# Patient Record
Sex: Female | Born: 1970 | Race: Black or African American | Hispanic: No | Marital: Single | Smoking: Current every day smoker
Health system: Southern US, Community
[De-identification: ages and names within clinical notes are randomized; demographics above are authoritative.]

## PROBLEM LIST (undated history)

## (undated) DIAGNOSIS — I10 Essential (primary) hypertension: Secondary | ICD-10-CM

## (undated) HISTORY — PX: LEG SURGERY: SHX1003

## (undated) HISTORY — PX: TUBAL LIGATION: SHX77

## (undated) HISTORY — PX: DILATION AND CURETTAGE OF UTERUS: SHX78

---

## 2002-09-26 ENCOUNTER — Emergency Department (HOSPITAL_COMMUNITY): Admission: EM | Admit: 2002-09-26 | Discharge: 2002-09-26 | Payer: Self-pay | Admitting: Emergency Medicine

## 2002-12-12 ENCOUNTER — Emergency Department (HOSPITAL_COMMUNITY): Admission: EM | Admit: 2002-12-12 | Discharge: 2002-12-12 | Payer: Self-pay | Admitting: Emergency Medicine

## 2005-03-17 ENCOUNTER — Emergency Department (HOSPITAL_COMMUNITY): Admission: EM | Admit: 2005-03-17 | Discharge: 2005-03-17 | Payer: Self-pay | Admitting: Emergency Medicine

## 2005-05-27 ENCOUNTER — Ambulatory Visit: Payer: Self-pay | Admitting: Family Medicine

## 2005-06-20 ENCOUNTER — Ambulatory Visit (HOSPITAL_COMMUNITY): Admission: RE | Admit: 2005-06-20 | Discharge: 2005-06-20 | Payer: Self-pay | Admitting: General Surgery

## 2005-06-20 ENCOUNTER — Ambulatory Visit (HOSPITAL_BASED_OUTPATIENT_CLINIC_OR_DEPARTMENT_OTHER): Admission: RE | Admit: 2005-06-20 | Discharge: 2005-06-20 | Payer: Self-pay | Admitting: General Surgery

## 2005-06-20 ENCOUNTER — Encounter (INDEPENDENT_AMBULATORY_CARE_PROVIDER_SITE_OTHER): Payer: Self-pay | Admitting: *Deleted

## 2005-09-30 ENCOUNTER — Encounter: Admission: RE | Admit: 2005-09-30 | Discharge: 2005-09-30 | Payer: Self-pay | Admitting: Family Medicine

## 2005-10-21 ENCOUNTER — Encounter: Admission: RE | Admit: 2005-10-21 | Discharge: 2005-10-21 | Payer: Self-pay | Admitting: Family Medicine

## 2007-03-26 ENCOUNTER — Emergency Department (HOSPITAL_COMMUNITY): Admission: EM | Admit: 2007-03-26 | Discharge: 2007-03-26 | Payer: Self-pay | Admitting: Emergency Medicine

## 2009-04-06 ENCOUNTER — Ambulatory Visit: Payer: Self-pay | Admitting: Physician Assistant

## 2009-04-06 DIAGNOSIS — J309 Allergic rhinitis, unspecified: Secondary | ICD-10-CM | POA: Insufficient documentation

## 2009-04-06 DIAGNOSIS — R030 Elevated blood-pressure reading, without diagnosis of hypertension: Secondary | ICD-10-CM | POA: Insufficient documentation

## 2009-04-06 DIAGNOSIS — R599 Enlarged lymph nodes, unspecified: Secondary | ICD-10-CM | POA: Insufficient documentation

## 2009-04-07 ENCOUNTER — Encounter: Payer: Self-pay | Admitting: Physician Assistant

## 2009-04-08 LAB — CONVERTED CEMR LAB
ALT: 8 units/L (ref 0–35)
AST: 11 units/L (ref 0–37)
Albumin: 4.2 g/dL (ref 3.5–5.2)
Alkaline Phosphatase: 57 units/L (ref 39–117)
BUN: 10 mg/dL (ref 6–23)
Basophils Absolute: 0 10*3/uL (ref 0.0–0.1)
Basophils Relative: 0 % (ref 0–1)
CO2: 22 meq/L (ref 19–32)
Calcium: 9.4 mg/dL (ref 8.4–10.5)
Chloride: 106 meq/L (ref 96–112)
Creatinine, Ser: 0.8 mg/dL (ref 0.40–1.20)
Eosinophils Absolute: 0.3 10*3/uL (ref 0.0–0.7)
Eosinophils Relative: 3 % (ref 0–5)
Glucose, Bld: 90 mg/dL (ref 70–99)
HCT: 42.2 % (ref 36.0–46.0)
Hemoglobin: 14 g/dL (ref 12.0–15.0)
Lymphocytes Relative: 28 % (ref 12–46)
Lymphs Abs: 3.6 10*3/uL (ref 0.7–4.0)
MCHC: 33.2 g/dL (ref 30.0–36.0)
MCV: 100.2 fL — ABNORMAL HIGH (ref 78.0–100.0)
Monocytes Absolute: 0.9 10*3/uL (ref 0.1–1.0)
Monocytes Relative: 7 % (ref 3–12)
Neutro Abs: 7.9 10*3/uL — ABNORMAL HIGH (ref 1.7–7.7)
Neutrophils Relative %: 62 % (ref 43–77)
Platelets: 254 10*3/uL (ref 150–400)
Potassium: 4.8 meq/L (ref 3.5–5.3)
RBC: 4.21 M/uL (ref 3.87–5.11)
RDW: 12.9 % (ref 11.5–15.5)
Sodium: 138 meq/L (ref 135–145)
Total Bilirubin: 0.4 mg/dL (ref 0.3–1.2)
Total Protein: 7.1 g/dL (ref 6.0–8.3)
WBC: 12.8 10*3/uL — ABNORMAL HIGH (ref 4.0–10.5)

## 2009-04-16 ENCOUNTER — Encounter: Admission: RE | Admit: 2009-04-16 | Discharge: 2009-04-16 | Payer: Self-pay | Admitting: Internal Medicine

## 2009-04-17 ENCOUNTER — Encounter: Payer: Self-pay | Admitting: Physician Assistant

## 2009-04-24 ENCOUNTER — Ambulatory Visit: Payer: Self-pay | Admitting: Physician Assistant

## 2009-04-28 ENCOUNTER — Telehealth: Payer: Self-pay | Admitting: Physician Assistant

## 2009-04-29 ENCOUNTER — Encounter: Payer: Self-pay | Admitting: Physician Assistant

## 2009-04-29 LAB — CONVERTED CEMR LAB
Basophils Absolute: 0 10*3/uL (ref 0.0–0.1)
Basophils Relative: 0 % (ref 0–1)
Eosinophils Absolute: 0.2 10*3/uL (ref 0.0–0.7)
Eosinophils Relative: 1 % (ref 0–5)
Folate: 7.8 ng/mL
HCT: 41 % (ref 36.0–46.0)
Hemoglobin: 13.1 g/dL (ref 12.0–15.0)
Lymphocytes Relative: 31 % (ref 12–46)
Lymphs Abs: 4 10*3/uL (ref 0.7–4.0)
MCHC: 32 g/dL (ref 30.0–36.0)
MCV: 102.5 fL — ABNORMAL HIGH (ref 78.0–100.0)
Monocytes Absolute: 1.1 10*3/uL — ABNORMAL HIGH (ref 0.1–1.0)
Monocytes Relative: 8 % (ref 3–12)
Neutro Abs: 7.7 10*3/uL (ref 1.7–7.7)
Neutrophils Relative %: 59 % (ref 43–77)
Platelets: 274 10*3/uL (ref 150–400)
RBC: 4 M/uL (ref 3.87–5.11)
RDW: 13.4 % (ref 11.5–15.5)
WBC: 12.9 10*3/uL — ABNORMAL HIGH (ref 4.0–10.5)

## 2009-05-04 DIAGNOSIS — R799 Abnormal finding of blood chemistry, unspecified: Secondary | ICD-10-CM | POA: Insufficient documentation

## 2009-05-04 LAB — CONVERTED CEMR LAB
TSH: 1.99 microintl units/mL (ref 0.350–4.500)
Vitamin B-12: 440 pg/mL (ref 211–911)

## 2009-05-05 ENCOUNTER — Telehealth: Payer: Self-pay | Admitting: Physician Assistant

## 2009-05-15 ENCOUNTER — Encounter (INDEPENDENT_AMBULATORY_CARE_PROVIDER_SITE_OTHER): Payer: Self-pay | Admitting: *Deleted

## 2009-06-23 ENCOUNTER — Ambulatory Visit: Payer: Self-pay | Admitting: Physician Assistant

## 2009-06-24 ENCOUNTER — Telehealth: Payer: Self-pay | Admitting: Physician Assistant

## 2009-06-24 ENCOUNTER — Encounter: Payer: Self-pay | Admitting: Physician Assistant

## 2009-06-25 ENCOUNTER — Encounter: Payer: Self-pay | Admitting: Physician Assistant

## 2009-06-26 ENCOUNTER — Encounter: Payer: Self-pay | Admitting: Physician Assistant

## 2009-06-26 ENCOUNTER — Ambulatory Visit (HOSPITAL_COMMUNITY): Admission: RE | Admit: 2009-06-26 | Discharge: 2009-06-26 | Payer: Self-pay | Admitting: Internal Medicine

## 2009-06-26 LAB — CONVERTED CEMR LAB
Basophils Absolute: 0.1 10*3/uL (ref 0.0–0.1)
Basophils Relative: 0 % (ref 0–1)
Eosinophils Absolute: 0.4 10*3/uL (ref 0.0–0.7)
Eosinophils Relative: 3 % (ref 0–5)
HCT: 44 % (ref 36.0–46.0)
Hemoglobin: 14.2 g/dL (ref 12.0–15.0)
Lymphocytes Relative: 27 % (ref 12–46)
Lymphs Abs: 3.8 10*3/uL (ref 0.7–4.0)
MCHC: 32.3 g/dL (ref 30.0–36.0)
MCV: 101.6 fL — ABNORMAL HIGH (ref 78.0–100.0)
Monocytes Absolute: 1.3 10*3/uL — ABNORMAL HIGH (ref 0.1–1.0)
Monocytes Relative: 10 % (ref 3–12)
Neutro Abs: 8.6 10*3/uL — ABNORMAL HIGH (ref 1.7–7.7)
Neutrophils Relative %: 61 % (ref 43–77)
Platelets: 244 10*3/uL (ref 150–400)
RBC: 4.33 M/uL (ref 3.87–5.11)
RDW: 13.1 % (ref 11.5–15.5)
Retic Ct Pct: 1.5 % (ref 0.4–3.1)
WBC: 14.2 10*3/uL — ABNORMAL HIGH (ref 4.0–10.5)

## 2009-08-13 ENCOUNTER — Ambulatory Visit: Payer: Self-pay | Admitting: Physician Assistant

## 2009-08-13 DIAGNOSIS — L723 Sebaceous cyst: Secondary | ICD-10-CM | POA: Insufficient documentation

## 2009-08-13 DIAGNOSIS — I10 Essential (primary) hypertension: Secondary | ICD-10-CM | POA: Insufficient documentation

## 2009-08-21 ENCOUNTER — Telehealth: Payer: Self-pay | Admitting: Physician Assistant

## 2009-08-24 DIAGNOSIS — D72829 Elevated white blood cell count, unspecified: Secondary | ICD-10-CM | POA: Insufficient documentation

## 2009-08-24 LAB — CONVERTED CEMR LAB
Basophils Absolute: 0 10*3/uL (ref 0.0–0.1)
Basophils Relative: 0 % (ref 0–1)
Eosinophils Absolute: 0.3 10*3/uL (ref 0.0–0.7)
Eosinophils Relative: 2 % (ref 0–5)
HCT: 42 % (ref 36.0–46.0)
Hemoglobin: 13.6 g/dL (ref 12.0–15.0)
Lymphocytes Relative: 25 % (ref 12–46)
Lymphs Abs: 3.7 10*3/uL (ref 0.7–4.0)
MCHC: 32.4 g/dL (ref 30.0–36.0)
MCV: 100 fL (ref 78.0–100.0)
Monocytes Absolute: 1 10*3/uL (ref 0.1–1.0)
Monocytes Relative: 7 % (ref 3–12)
Neutro Abs: 9.8 10*3/uL — ABNORMAL HIGH (ref 1.7–7.7)
Neutrophils Relative %: 66 % (ref 43–77)
Platelets: 275 10*3/uL (ref 150–400)
RBC: 4.2 M/uL (ref 3.87–5.11)
RDW: 14 % (ref 11.5–15.5)
WBC: 14.8 10*3/uL — ABNORMAL HIGH (ref 4.0–10.5)

## 2009-08-27 ENCOUNTER — Ambulatory Visit: Payer: Self-pay | Admitting: Physician Assistant

## 2009-09-02 ENCOUNTER — Encounter: Payer: Self-pay | Admitting: Physician Assistant

## 2009-09-08 ENCOUNTER — Ambulatory Visit: Payer: Self-pay | Admitting: Physician Assistant

## 2009-09-16 ENCOUNTER — Ambulatory Visit: Payer: Self-pay | Admitting: Physician Assistant

## 2009-09-16 LAB — CONVERTED CEMR LAB
BUN: 9 mg/dL (ref 6–23)
CO2: 24 meq/L (ref 19–32)
Calcium: 9.1 mg/dL (ref 8.4–10.5)
Chloride: 105 meq/L (ref 96–112)
Creatinine, Ser: 0.59 mg/dL (ref 0.40–1.20)
Glucose, Bld: 78 mg/dL (ref 70–99)
Potassium: 4.5 meq/L (ref 3.5–5.3)
Sodium: 140 meq/L (ref 135–145)

## 2009-09-18 ENCOUNTER — Encounter: Payer: Self-pay | Admitting: Physician Assistant

## 2009-09-20 ENCOUNTER — Encounter: Payer: Self-pay | Admitting: Physician Assistant

## 2009-09-22 ENCOUNTER — Ambulatory Visit: Payer: Self-pay | Admitting: Physician Assistant

## 2009-09-22 DIAGNOSIS — R82998 Other abnormal findings in urine: Secondary | ICD-10-CM | POA: Insufficient documentation

## 2009-09-22 DIAGNOSIS — F17200 Nicotine dependence, unspecified, uncomplicated: Secondary | ICD-10-CM | POA: Insufficient documentation

## 2009-09-22 LAB — CONVERTED CEMR LAB
Bilirubin Urine: NEGATIVE
Glucose, Urine, Semiquant: NEGATIVE
KOH Prep: NEGATIVE
Ketones, urine, test strip: NEGATIVE
Nitrite: NEGATIVE
Protein, U semiquant: NEGATIVE
Rapid HIV Screen: NEGATIVE
Specific Gravity, Urine: 1.02
Urobilinogen, UA: 0.2
WBC Urine, dipstick: NEGATIVE
Whiff Test: NEGATIVE
pH: 6

## 2009-09-23 ENCOUNTER — Encounter: Payer: Self-pay | Admitting: Physician Assistant

## 2009-09-24 LAB — CONVERTED CEMR LAB
Casts: NONE SEEN /lpf
Chlamydia, DNA Probe: NEGATIVE
Crystals: NONE SEEN
GC Probe Amp, Genital: NEGATIVE

## 2009-10-01 ENCOUNTER — Ambulatory Visit: Payer: Self-pay | Admitting: Physician Assistant

## 2009-10-02 ENCOUNTER — Encounter: Payer: Self-pay | Admitting: Physician Assistant

## 2009-10-02 LAB — CONVERTED CEMR LAB
ALT: 32 units/L (ref 0–35)
AST: 29 units/L (ref 0–37)
Albumin: 4 g/dL (ref 3.5–5.2)
Alkaline Phosphatase: 52 units/L (ref 39–117)
BUN: 13 mg/dL (ref 6–23)
Basophils Absolute: 0.1 10*3/uL (ref 0.0–0.1)
Basophils Relative: 0 % (ref 0–1)
CO2: 22 meq/L (ref 19–32)
Calcium: 9 mg/dL (ref 8.4–10.5)
Chloride: 106 meq/L (ref 96–112)
Cholesterol, target level: 200 mg/dL
Cholesterol: 153 mg/dL (ref 0–200)
Creatinine, Ser: 0.65 mg/dL (ref 0.40–1.20)
Eosinophils Absolute: 0.2 10*3/uL (ref 0.0–0.7)
Eosinophils Relative: 1 % (ref 0–5)
Glucose, Bld: 88 mg/dL (ref 70–99)
HCT: 41.3 % (ref 36.0–46.0)
HDL goal, serum: 40 mg/dL
HDL: 53 mg/dL (ref >39)
Hemoglobin: 13.4 g/dL (ref 12.0–15.0)
LDL Cholesterol: 82 mg/dL (ref 0–99)
LDL Goal: 130 mg/dL
Lymphocytes Relative: 29 % (ref 12–46)
Lymphs Abs: 3.8 10*3/uL (ref 0.7–4.0)
MCHC: 32.4 g/dL (ref 30.0–36.0)
MCV: 99.3 fL (ref 78.0–100.0)
Monocytes Absolute: 0.9 10*3/uL (ref 0.1–1.0)
Monocytes Relative: 7 % (ref 3–12)
Neutro Abs: 8.2 10*3/uL — ABNORMAL HIGH (ref 1.7–7.7)
Neutrophils Relative %: 62 % (ref 43–77)
Platelets: 231 10*3/uL (ref 150–400)
Potassium: 4.3 meq/L (ref 3.5–5.3)
RBC: 4.16 M/uL (ref 3.87–5.11)
RDW: 13.8 % (ref 11.5–15.5)
Sodium: 137 meq/L (ref 135–145)
Total Bilirubin: 0.6 mg/dL (ref 0.3–1.2)
Total CHOL/HDL Ratio: 2.9
Total Protein: 6.9 g/dL (ref 6.0–8.3)
Triglycerides: 92 mg/dL (ref <150)
VLDL: 18 mg/dL (ref 0–40)
WBC: 13.2 10*3/uL — ABNORMAL HIGH (ref 4.0–10.5)

## 2009-11-20 ENCOUNTER — Ambulatory Visit: Payer: Self-pay | Admitting: Physician Assistant

## 2009-11-20 DIAGNOSIS — B351 Tinea unguium: Secondary | ICD-10-CM | POA: Insufficient documentation

## 2009-11-21 ENCOUNTER — Encounter: Payer: Self-pay | Admitting: Physician Assistant

## 2009-11-21 LAB — CONVERTED CEMR LAB
Basophils Absolute: 0.1 10*3/uL (ref 0.0–0.1)
Basophils Relative: 0 % (ref 0–1)
Eosinophils Absolute: 0.4 10*3/uL (ref 0.0–0.7)
Eosinophils Relative: 3 % (ref 0–5)
HCT: 42 % (ref 36.0–46.0)
Hemoglobin: 13.8 g/dL (ref 12.0–15.0)
Lymphocytes Relative: 33 % (ref 12–46)
Lymphs Abs: 4.1 10*3/uL — ABNORMAL HIGH (ref 0.7–4.0)
MCHC: 32.9 g/dL (ref 30.0–36.0)
MCV: 101.7 fL — ABNORMAL HIGH (ref 78.0–100.0)
Monocytes Absolute: 1.1 10*3/uL — ABNORMAL HIGH (ref 0.1–1.0)
Monocytes Relative: 9 % (ref 3–12)
Neutro Abs: 6.8 10*3/uL (ref 1.7–7.7)
Neutrophils Relative %: 55 % (ref 43–77)
Platelets: 227 10*3/uL (ref 150–400)
RBC: 4.13 M/uL (ref 3.87–5.11)
RDW: 14 % (ref 11.5–15.5)
WBC: 12.4 10*3/uL — ABNORMAL HIGH (ref 4.0–10.5)

## 2009-12-04 ENCOUNTER — Ambulatory Visit: Payer: Self-pay | Admitting: Physician Assistant

## 2009-12-04 LAB — CONVERTED CEMR LAB
BUN: 23 mg/dL (ref 6–23)
CO2: 24 meq/L (ref 19–32)
Calcium: 9.5 mg/dL (ref 8.4–10.5)
Chloride: 102 meq/L (ref 96–112)
Creatinine, Ser: 0.72 mg/dL (ref 0.40–1.20)
Glucose, Bld: 90 mg/dL (ref 70–99)
Potassium: 4.1 meq/L (ref 3.5–5.3)
Sodium: 137 meq/L (ref 135–145)

## 2009-12-07 ENCOUNTER — Encounter: Payer: Self-pay | Admitting: Physician Assistant

## 2009-12-23 ENCOUNTER — Ambulatory Visit: Payer: Self-pay | Admitting: Physician Assistant

## 2010-01-13 ENCOUNTER — Ambulatory Visit: Payer: Self-pay | Admitting: Physician Assistant

## 2010-01-13 LAB — CONVERTED CEMR LAB
ALT: 11 units/L (ref 0–35)
AST: 12 units/L (ref 0–37)
Albumin: 4.1 g/dL (ref 3.5–5.2)
Alkaline Phosphatase: 55 units/L (ref 39–117)
Bilirubin, Direct: 0.1 mg/dL (ref 0.0–0.3)
Indirect Bilirubin: 0.2 mg/dL (ref 0.0–0.9)
Total Bilirubin: 0.3 mg/dL (ref 0.3–1.2)
Total Protein: 7.1 g/dL (ref 6.0–8.3)

## 2010-01-14 ENCOUNTER — Encounter: Payer: Self-pay | Admitting: Physician Assistant

## 2010-02-11 ENCOUNTER — Telehealth: Payer: Self-pay | Admitting: Physician Assistant

## 2010-06-01 ENCOUNTER — Ambulatory Visit: Payer: Self-pay | Admitting: Physician Assistant

## 2010-06-01 DIAGNOSIS — N898 Other specified noninflammatory disorders of vagina: Secondary | ICD-10-CM | POA: Insufficient documentation

## 2010-06-01 DIAGNOSIS — N760 Acute vaginitis: Secondary | ICD-10-CM | POA: Insufficient documentation

## 2010-06-01 LAB — CONVERTED CEMR LAB
Bilirubin Urine: NEGATIVE
Blood in Urine, dipstick: NEGATIVE
Glucose, Urine, Semiquant: NEGATIVE
KOH Prep: NEGATIVE
Ketones, urine, test strip: NEGATIVE
Nitrite: NEGATIVE
Protein, U semiquant: NEGATIVE
Specific Gravity, Urine: 1.025
Urobilinogen, UA: 0.2
WBC Urine, dipstick: NEGATIVE
Whiff Test: NEGATIVE
pH: 5.5

## 2010-06-04 LAB — CONVERTED CEMR LAB
Basophils Absolute: 0.1 10*3/uL (ref 0.0–0.1)
Basophils Relative: 0 % (ref 0–1)
Chlamydia, DNA Probe: NEGATIVE
Eosinophils Absolute: 0.3 10*3/uL (ref 0.0–0.7)
Eosinophils Relative: 2 % (ref 0–5)
GC Probe Amp, Genital: NEGATIVE
HCT: 41.8 % (ref 36.0–46.0)
Hemoglobin: 13.6 g/dL (ref 12.0–15.0)
Lymphocytes Relative: 34 % (ref 12–46)
Lymphs Abs: 4.1 10*3/uL — ABNORMAL HIGH (ref 0.7–4.0)
MCHC: 32.5 g/dL (ref 30.0–36.0)
MCV: 101.5 fL — ABNORMAL HIGH (ref 78.0–100.0)
Monocytes Absolute: 1 10*3/uL (ref 0.1–1.0)
Monocytes Relative: 8 % (ref 3–12)
Neutro Abs: 6.6 10*3/uL (ref 1.7–7.7)
Neutrophils Relative %: 55 % (ref 43–77)
Platelets: 262 10*3/uL (ref 150–400)
RBC: 4.12 M/uL (ref 3.87–5.11)
RDW: 13.9 % (ref 11.5–15.5)
WBC: 12.1 10*3/uL — ABNORMAL HIGH (ref 4.0–10.5)

## 2010-06-08 ENCOUNTER — Encounter: Admission: RE | Admit: 2010-06-08 | Discharge: 2010-06-08 | Payer: Self-pay | Admitting: Internal Medicine

## 2010-06-16 ENCOUNTER — Ambulatory Visit: Payer: Self-pay | Admitting: Internal Medicine

## 2010-06-22 ENCOUNTER — Telehealth: Payer: Self-pay | Admitting: Physician Assistant

## 2010-06-24 ENCOUNTER — Encounter (INDEPENDENT_AMBULATORY_CARE_PROVIDER_SITE_OTHER): Payer: Self-pay | Admitting: *Deleted

## 2010-06-30 ENCOUNTER — Encounter: Payer: Self-pay | Admitting: Physician Assistant

## 2010-06-30 LAB — CBC WITH DIFFERENTIAL/PLATELET
BASO%: 0.3 % (ref 0.0–2.0)
Basophils Absolute: 0 10*3/uL (ref 0.0–0.1)
EOS%: 2 % (ref 0.0–7.0)
Eosinophils Absolute: 0.2 10*3/uL (ref 0.0–0.5)
HCT: 39.6 % (ref 34.8–46.6)
HGB: 13.7 g/dL (ref 11.6–15.9)
LYMPH%: 25.6 % (ref 14.0–49.7)
MCH: 34.3 pg — ABNORMAL HIGH (ref 25.1–34.0)
MCHC: 34.7 g/dL (ref 31.5–36.0)
MCV: 98.8 fL (ref 79.5–101.0)
MONO#: 0.7 10*3/uL (ref 0.1–0.9)
MONO%: 7.8 % (ref 0.0–14.0)
NEUT#: 6 10*3/uL (ref 1.5–6.5)
NEUT%: 64.3 % (ref 38.4–76.8)
Platelets: 220 10*3/uL (ref 145–400)
RBC: 4.01 10*6/uL (ref 3.70–5.45)
RDW: 13.7 % (ref 11.2–14.5)
WBC: 9.4 10*3/uL (ref 3.9–10.3)
lymph#: 2.4 10*3/uL (ref 0.9–3.3)

## 2010-07-05 LAB — COMPREHENSIVE METABOLIC PANEL
ALT: 9 U/L (ref 0–35)
AST: 13 U/L (ref 0–37)
Albumin: 3.8 g/dL (ref 3.5–5.2)
Alkaline Phosphatase: 54 U/L (ref 39–117)
BUN: 12 mg/dL (ref 6–23)
CO2: 22 mEq/L (ref 19–32)
Calcium: 8.8 mg/dL (ref 8.4–10.5)
Chloride: 107 mEq/L (ref 96–112)
Creatinine, Ser: 0.71 mg/dL (ref 0.40–1.20)
Glucose, Bld: 91 mg/dL (ref 70–99)
Potassium: 4.2 mEq/L (ref 3.5–5.3)
Sodium: 136 mEq/L (ref 135–145)
Total Bilirubin: 0.4 mg/dL (ref 0.3–1.2)
Total Protein: 6.5 g/dL (ref 6.0–8.3)

## 2010-07-05 LAB — LEUKOCYTE ALKALINE PHOSPHATASE: Leukocyte Alkaline Phos Stain: 143 — ABNORMAL HIGH (ref 30–140)

## 2010-07-05 LAB — LACTATE DEHYDROGENASE: LDH: 135 U/L (ref 94–250)

## 2010-07-09 ENCOUNTER — Encounter (INDEPENDENT_AMBULATORY_CARE_PROVIDER_SITE_OTHER): Payer: Self-pay | Admitting: Internal Medicine

## 2010-09-26 ENCOUNTER — Encounter: Payer: Self-pay | Admitting: Family Medicine

## 2010-10-05 NOTE — Progress Notes (Signed)
Summary: Axillary LN  ---- Converted from flag ---- ---- 09/20/2009 10:50 AM, Tereso Newcomer PA-C wrote: F/u on path of R axillary mass once done; pending with Dr. Laurell Josephs; if benign, needs further w/u of elev. WBC ------------------------------  Phone Note Outgoing Call   Summary of Call: Please make sure patient makes appt with surgeon when able.  If there is a way to send her to someone else, please do so.  [facility] ??? Initial call taken by: Tereso Newcomer PA-C,  June 22, 2010 1:43 PM  Follow-up for Phone Call        Left message on answering machine for pt to call back.Marland KitchenMarland KitchenMarland KitchenArmenia Shannon  June 22, 2010 4:10 PM  Left message on answer machine for pt. to return call. Gaylyn Cheers RN  June 23, 2010 10:24 AM    Left message on answering machine for pt to call back....will mail letter.[place] Shannon  June 24, 2010 9:43 AM..

## 2010-10-05 NOTE — Letter (Signed)
Summary: EN SURGERY NOTES  EN SURGERY NOTES   Imported By: Arta Bruce 10/22/2009 11:45:09  _____________________________________________________________________  External Attachment:    Type:   Image     Comment:   External Document

## 2010-10-05 NOTE — Letter (Signed)
Summary: *HSN Results Follow up  [facility]  [address] [place], [place] [address]   Phone: [phone]  Fax: [phone]      11/21/2009   [facility] [place] [address] [place], [place]  [address]   Dear  Ms. Saumya Ingwersen,                            ____S.Drinkard,FNP   ____D. Gore,FNP       ____B. McPherson,MD   ____V. Rankins,MD    ____E. Mulberry,MD    ____N. Daphine Deutscher, FNP  ____D. Reche Dixon, MD    ____K. Philipp Deputy, MD    __x__S. Alben Spittle, PA-C     This letter is to inform you that your recent test(s):  _______Pap Smear    ___x____Lab Test     _______X-ray    ___x____ is within acceptable limits  _______ requires a medication change  _______ requires a follow-up lab visit  _______ requires a follow-up visit with your provider   Comments: No significant change on your lab work.  Your Laws blood cell count is still somewhat high.  I need to see what the biopsy on your lymph node shows first before we do anything else.       _________________________________________________________ If you have any questions, please contact our office                     Sincerely,  Tereso Newcomer PA-C [facility]

## 2010-10-05 NOTE — Assessment & Plan Note (Signed)
Summary: 2 MONTH FU///KT   Vital Signs:  Patient profile:   40 year old female Menstrual status:  regular Height:      61 inches Weight:      198 pounds BMI:     37.55 Temp:     97.9 degrees F oral Pulse rate:   70 / minute Pulse rhythm:   regular Resp:     18 per minute BP sitting:   134 / 90  (left arm) Cuff size:   large  Vitals Entered By: [place] Shannon (November 20, 2009 9:25 AM) CC: two month f/u... pt says her left foot is very dry and her big toe that half her nail came off..., Hypertension Management Is Patient Diabetic? No Pain Assessment Patient in pain? no       Does patient need assistance? Functional Status Self care Ambulation Normal   Primary Care Provider:  Tereso Newcomer PA-C  CC:  two month f/u... pt says her left foot is very dry and her big toe that half her nail came off... and Hypertension Management.  History of Present Illness: Here for f/u. Taking BP meds. Feels ok.  Lymphadenopathy:  Still has not had excised.  Wants to wait until May.  Needs to pay off bill with Dr. Janee Morn first.  Does not feel any bigger.  No fever or night sweats.  Has thick yellow toenails on left.  Great toenail fractured this morning.  Slight pain.  Hypertension History:      She denies headache, chest pain, palpitations, dyspnea with exertion, peripheral edema, and syncope.  She notes no problems with any antihypertensive medication side effects.        Positive major cardiovascular risk factors include hypertension and current tobacco user.  Negative major cardiovascular risk factors include female age less than 20 years old and no history of diabetes.        Further assessment for target organ damage reveals no history of ASHD, stroke/TIA, or peripheral vascular disease.     Problems Prior to Update: 1)  Onychomycosis, Toenails  (ICD-110.1) 2)  Smoker  (ICD-305.1) 3)  Urinalysis, Abnormal  (ICD-791.9) 4)  Routine Gynecological Examination  (ICD-V72.31) 5)   Preventive Health Care  (ICD-V70.0) 6)  Leukocytosis  (ICD-288.60) 7)  Essential Hypertension, Benign  (ICD-401.1) 8)  Sebaceous Cyst  (ICD-706.2) 9)  Cbc, Abnormal  (ICD-790.99) 10)  Elevated Bp Reading Without Dx Hypertension  (ICD-796.2) 11)  Lymphadenopathy, Axilla  (ICD-785.6) 12)  Allergic Rhinitis  (ICD-477.9)  Allergies: 1)  ! * Aleve  Physical Exam  General:  alert, well-developed, and well-nourished.   Head:  normocephalic and atraumatic.   Neck:  supple.   Lungs:  normal breath sounds.   Heart:  normal rate and regular rhythm.   Extremities:  no edema left foot with onychomycosis of nails left great toenail fractured Neurologic:  alert & oriented X3 and cranial nerves II-XII intact.   Skin:  scaling of left foot c/w tinea Axillary Nodes:  R axillary LN enlarged.   Psych:  normally interactive.     Impression & Recommendations:  Problem # 1:  ESSENTIAL HYPERTENSION, BENIGN (ICD-401.1) borderline increase Lisinopril/HCTZ to 20/25 check bmet in 2 weeks  Her updated medication list for this problem includes:    Lisinopril-hydrochlorothiazide 20-25 Mg Tabs (Lisinopril-hydrochlorothiazide) .Marland Kitchen... Take 1 tablet by mouth once a day for blood pressure  Problem # 2:  ONYCHOMYCOSIS, TOENAILS (ICD-110.1)  no h/o liver problems LFTs normal in Jan start Lamisil recheck LFTs in 8  weeks  Her updated medication list for this problem includes:    Lamisil 250 Mg Tabs (Terbinafine hcl) .Marland Kitchen... Take 1 tablet by mouth once a day  Problem # 3:  LEUKOCYTOSIS (ICD-288.60)  repeat cbc with bmet in 2 weeks  Orders: T-CBC w/Diff ([id number])  Problem # 4:  LYMPHADENOPATHY, AXILLA (ICD-785.6) w/u in past negative has seen surgeon wants to wait until May to have done was told by surgeon not an emergency I have encouraged her to get done as soon as she can  Her updated medication list for this problem includes:    Metronidazole 500 Mg Tabs (Metronidazole) .Marland KitchenMarland KitchenMarland KitchenMarland Kitchen 4 tabs by  mouth x 1  Problem # 5:  SMOKER (ICD-305.1) has cut back has not tried nicotine patches  Her updated medication list for this problem includes:    Nicoderm Cq 14 Mg/24hr Pt24 (Nicotine) .Marland Kitchen... Apply once daily for 4 weeks; do not smoke with patch on    Nicoderm Cq 7 Mg/24hr Pt24 (Nicotine) .Marland Kitchen... Apply once daily after wearing the 14 mg patch for one month; continue for 2 weeks, then stop.  do not smoke with patch on.  Complete Medication List: 1)  Lisinopril-hydrochlorothiazide 20-25 Mg Tabs (Lisinopril-hydrochlorothiazide) .... Take 1 tablet by mouth once a day for blood pressure 2)  Nicoderm Cq 14 Mg/24hr Pt24 (Nicotine) .... Apply once daily for 4 weeks; do not smoke with patch on 3)  Nicoderm Cq 7 Mg/24hr Pt24 (Nicotine) .... Apply once daily after wearing the 14 mg patch for one month; continue for 2 weeks, then stop.  do not smoke with patch on. 4)  Metronidazole 500 Mg Tabs (Metronidazole) .... 4 tabs by mouth x 1 5)  Lamisil 250 Mg Tabs (Terbinafine hcl) .... Take 1 tablet by mouth once a day  Hypertension Assessment/Plan:      The patient's hypertensive risk group is category B: At least one risk factor (excluding diabetes) with no target organ damage.  Her calculated 10 year risk of coronary heart disease is 3 %.  Today's blood pressure is 134/90.  Her blood pressure goal is < 140/90.  Patient Instructions: 1)  Return in 2 weeks for BMET and CBC  (Dx 401.1, 288.60). 2)  Return in 6-8 weeks for LFTs.  Dx: 110.1 3)  Please schedule a follow-up appointment in 3 months with Scott for blood pressure.  4)  Please schedule appointment with the surgeon as soon as you are able. 5)  Schedule appt at [facility]. podiatry clinic for nail trimming (onychomycosis). Prescriptions: LAMISIL 250 MG TABS (TERBINAFINE HCL) Take 1 tablet by mouth once a day  #30 x 2   Entered and Authorized by:   Tereso Newcomer PA-C   Signed by:   Tereso Newcomer PA-C on 11/20/2009   Method used:   Print then Give to  Patient   RxID:   [id number] LISINOPRIL-HYDROCHLOROTHIAZIDE 20-25 MG TABS (LISINOPRIL-HYDROCHLOROTHIAZIDE) Take 1 tablet by mouth once a day for blood pressure  #30 x 5   Entered and Authorized by:   Tereso Newcomer PA-C   Signed by:   Tereso Newcomer PA-C on 11/20/2009   Method used:   Print then Give to Patient   RxID:   [id number]

## 2010-10-05 NOTE — Progress Notes (Signed)
Summary: Axillary Mass f/u  Phone Note Outgoing Call   Summary of Call: Has she had axillary mass removed yet with Dr. Janee Morn? Initial call taken by: Tereso Newcomer PA-C,  February 11, 2010 4:11 PM  Follow-up for Phone Call        Left message on answering machine for pt to call back...Marland KitchenMarland KitchenArmenia Shannon  February 12, 2010 9:58 AM   pt says she doesnt have the funds to go to appt.... pt says she will as soon as she pays down the bill she already have Follow-up by: [place] Shannon,  February 12, 2010 2:41 PM

## 2010-10-05 NOTE — Progress Notes (Signed)
Summary: Office Visit//DEPRESSION SCREENING  Office Visit//DEPRESSION SCREENING   Imported By: Arta Bruce 11/03/2009 14:38:02  _____________________________________________________________________  External Attachment:    Type:   Image     Comment:   External Document

## 2010-10-05 NOTE — Letter (Signed)
Summary: *HSN Results Follow up  [facility]  [address] [place], [place] [address]   Phone: [phone]  Fax: [phone]      09/18/2009   [facility] [place] [address] [place], [place]  [address]   Dear  Ms. Loretta Norris,                            ____S.Drinkard,FNP   ____D. Gore,FNP       ____B. McPherson,MD   ____V. Rankins,MD    ____E. Mulberry,MD    ____N. Daphine Deutscher, FNP  ____D. Reche Dixon, MD    ____K. Philipp Deputy, MD    __x__S. Alben Spittle, PA-C     This letter is to inform you that your recent test(s):  _______Pap Smear    ___x____Lab Test     _______X-ray    ___x____ is within acceptable limits  _______ requires a medication change  _______ requires a follow-up lab visit  _______ requires a follow-up visit with your provider   Comments:       _________________________________________________________ If you have any questions, please contact our office                     Sincerely,  Tereso Newcomer PA-C [facility]

## 2010-10-05 NOTE — Letter (Signed)
Summary: *HSN Results Follow up  [facility]  [address] [place], [place] [address]   Phone: [phone]  Fax: [phone]      12/07/2009   [facility] [address] [place], [place]  [address]   Dear  Ms. Loretta Norris,                            ____S.Drinkard,FNP   ____D. Gore,FNP       ____B. McPherson,MD   ____V. Rankins,MD    ____E. Mulberry,MD    ____N. Daphine Deutscher, FNP  ____D. Reche Dixon, MD    ____K. Philipp Deputy, MD    __x__S. Alben Spittle, PA-C     This letter is to inform you that your recent test(s):  _______Pap Smear    ___x____Lab Test     _______X-ray    ___x____ is within acceptable limits  _______ requires a medication change  _______ requires a follow-up lab visit  _______ requires a follow-up visit with your provider   Comments:       _________________________________________________________ If you have any questions, please contact our office                     Sincerely,  Tereso Newcomer PA-C [facility]

## 2010-10-05 NOTE — Letter (Signed)
Summary: *HSN Results Follow up  [facility]  [address] [place], [place] [address]   Phone: [phone]  Fax: [phone]      01/14/2010   [facility] [address] [place], [place]  [address]   Dear  Ms. Zuleima Fierro,                            ____S.Drinkard,FNP   ____D. Gore,FNP       ____B. McPherson,MD   ____V. Rankins,MD    ____E. Mulberry,MD    ____N. Daphine Deutscher, FNP  ____D. Reche Dixon, MD    ____K. Philipp Deputy, MD    __x__S. Alben Spittle, PA-C     This letter is to inform you that your recent test(s):  _______Pap Smear    ___x____Lab Test     _______X-ray    ___x____ is within acceptable limits  _______ requires a medication change  _______ requires a follow-up lab visit  _______ requires a follow-up visit with your provider   Comments: Liver enzymes were normal.         _________________________________________________________ If you have any questions, please contact our office                     Sincerely,  Tereso Newcomer PA-C [facility]

## 2010-10-05 NOTE — Letter (Signed)
Summary: *HSN Results Follow up  [facility]  [address] [place], [place] [address]   Phone: [phone]  Fax: [phone]      06/24/2010   [facility] [place] [address] [place], [place]  [address]   Dear  Ms. Adelyn Arreaga,                            ____S.Drinkard,FNP   ____D. Gore,FNP       ____B. McPherson,MD   ____V. Rankins,MD    ____E. Mulberry,MD    ____N. Daphine Deutscher, FNP  ____D. Reche Dixon, MD    ____K. Philipp Deputy, MD    ____Other     This letter is to inform you that your recent test(s):  _______Pap Smear    _______Lab Test     _______X-ray    _______ is within acceptable limits  _______ requires a medication change  _______ requires a follow-up lab visit  _______ requires a follow-up visit with your Gilman Olazabal   Comments:  We have been trying to reach you.  Please give the office a call at your earliest convenience.       _________________________________________________________ If you have any questions, please contact our office                     Sincerely,  [place] Shannon [facility]

## 2010-10-05 NOTE — Letter (Signed)
[facility]NEW [facility]  [facility]   Imported By: Arta Bruce 07/21/2010 11:38:15  _____________________________________________________________________  External Attachment:    Type:   Image     Comment:   External Document

## 2010-10-05 NOTE — Assessment & Plan Note (Signed)
Summary: Bacterial Vaginosis   Vital Signs:  Patient profile:   40 year old female Menstrual status:  regular Weight:      201.8 pounds BMI:     38.27 Temp:     97.2 degrees F oral Pulse rate:   56 / minute Pulse rhythm:   regular Resp:     24 per minute BP sitting:   140 / 95  (left arm)  Vitals Entered By: [place] Shannon (June 01, 2010 9:47 AM) CC: pt is here for vaginal ordor and idscharge... Is Patient Diabetic? No Pain Assessment Patient in pain? no       Does patient need assistance? Functional Status Self care Ambulation Normal   Primary Care Provider:  Tereso Newcomer PA-C  CC:  pt is here for vaginal ordor and idscharge....  History of Present Illness: Here for vaginal discharge and odor.  Has noted over last several weeks.  Sexually active.  One partner.  She is concerned about STDs.  She denies fevers.  No back pain.  No dysuria or frequency.  Notes some yellowish discharge.  Periods have been heavier and assoc with more cramping over last several mos.  No dypareunia.  Problems Prior to Update: 1)  Onychomycosis, Toenails  (ICD-110.1) 2)  Smoker  (ICD-305.1) 3)  Urinalysis, Abnormal  (ICD-791.9) 4)  Routine Gynecological Examination  (ICD-V72.31) 5)  Preventive Health Care  (ICD-V70.0) 6)  Leukocytosis  (ICD-288.60) 7)  Essential Hypertension, Benign  (ICD-401.1) 8)  Sebaceous Cyst  (ICD-706.2) 9)  Cbc, Abnormal  (ICD-790.99) 10)  Elevated Bp Reading Without Dx Hypertension  (ICD-796.2) 11)  Lymphadenopathy, Axilla  (ICD-785.6) 12)  Allergic Rhinitis  (ICD-477.9)  Current Medications (verified): 1)  Lisinopril-Hydrochlorothiazide 20-25 Mg Tabs (Lisinopril-Hydrochlorothiazide) .... Take 1 Tablet By Mouth Once A Day For Blood Pressure 2)  Nicoderm Cq 14 Mg/24hr Pt24 (Nicotine) .... Apply Once Daily For 4 Weeks; Do Not Smoke With Patch On 3)  Nicoderm Cq 7 Mg/24hr Pt24 (Nicotine) .... Apply Once Daily After Wearing The 14 Mg Patch For One Month; Continue  For 2 Weeks, Then Stop.  Do Not Smoke With Patch On. 4)  Metronidazole 500 Mg Tabs (Metronidazole) .... 4 Tabs By Mouth X 1 5)  Lamisil 250 Mg Tabs (Terbinafine Hcl) .... Take 1 Tablet By Mouth Once A Day  Allergies (verified): 1)  ! * Aleve  Physical Exam  General:  alert, well-developed, and well-nourished.   Head:  normocephalic and atraumatic.   Neck:  supple.   Lungs:  normal breath sounds.   Heart:  normal rate and regular rhythm.   Abdomen:  soft and non-tender.   Genitalia:  normal introitus, no external lesions, mucosa pink and moist, no vaginal or cervical lesions, no vaginal atrophy, and no friaility or hemorrhage.  NO CMT whitish discharge detectable odor prevelant Neurologic:  alert & oriented X3 and cranial nerves II-XII intact.   Psych:  normally interactive.     Impression & Recommendations:  Problem # 1:  VAGINAL DISCHARGE (ICD-623.5) with concerns over STD, check for GC and chlamydia and HIV  Orders: KOH/ WET [place] [phone]) T- GC Chlamydia ([address]) T-HIV Antibody  (Reflex) ([id number])  Problem # 2:  BACTERIAL VAGINITIS (ICD-616.10) tx with flagyl  Her updated medication list for this problem includes:    Metronidazole 500 Mg Tabs (Metronidazole) .Marland Kitchen... Take 1 tablet by mouth two times a day  Orders: KOH/ WET [place] [phone]) T- GC Chlamydia ([address]) T-HIV Antibody  (Reflex) [id number])  Problem # 3:  ESSENTIAL  HYPERTENSION, BENIGN (ICD-401.1) no meds yet today  Her updated medication list for this problem includes:    Lisinopril-hydrochlorothiazide 20-25 Mg Tabs (Lisinopril-hydrochlorothiazide) .Marland Kitchen... Take 1 tablet by mouth once a day for blood pressure  Problem # 4:  LEUKOCYTOSIS (ICD-288.60) never had f/u labs never had axillary LN taken care of discussed again with her today  Orders: T-CBC w/Diff ([id number])  Complete Medication List: 1)  Lisinopril-hydrochlorothiazide 20-25 Mg Tabs (Lisinopril-hydrochlorothiazide) .... Take 1 tablet by  mouth once a day for blood pressure 2)  Nicoderm Cq 14 Mg/24hr Pt24 (Nicotine) .... Apply once daily for 4 weeks; do not smoke with patch on 3)  Nicoderm Cq 7 Mg/24hr Pt24 (Nicotine) .... Apply once daily after wearing the 14 mg patch for one month; continue for 2 weeks, then stop.  do not smoke with patch on. 4)  Metronidazole 500 Mg Tabs (Metronidazole) .... Take 1 tablet by mouth two times a day 5)  Lamisil 250 Mg Tabs (Terbinafine hcl) .... Take 1 tablet by mouth once a day  Patient Instructions: 1)  Take metronidazole until all gone.  Do not drink alcohol with it; this will make you sick. Prescriptions: METRONIDAZOLE 500 MG TABS (METRONIDAZOLE) Take 1 tablet by mouth two times a day  #14 x 0   Entered and Authorized by:   Tereso Newcomer PA-C   Signed by:   Tereso Newcomer PA-C on 06/01/2010   Method used:   Print then Give to Patient   RxID:   [id number]   Laboratory Results   Urine Tests  Date/Time Received: June 01, 2010 9:57 AM   Routine Urinalysis   Glucose: negative   (Normal Range: Negative) Bilirubin: negative   (Normal Range: Negative) Ketone: negative   (Normal Range: Negative) Spec. Gravity: 1.025   (Normal Range: 1.003-1.035) Blood: negative   (Normal Range: Negative) pH: 5.5   (Normal Range: 5.0-8.0) Protein: negative   (Normal Range: Negative) Urobilinogen: 0.2   (Normal Range: 0-1) Nitrite: negative   (Normal Range: Negative) Leukocyte Esterace: negative   (Normal Range: Negative)      Wet Mount Source: vaginal WBC/hpf: 1-5 Bacteria/hpf: rare Clue cells/hpf: moderate  Negative whiff Yeast/hpf: none Wet Mount KOH: Negative Trichomonas/hpf: none   Appended Document: Bacterial Vaginosis  Laboratory Results  Date/Time Received: June 01, 2010 11:54 AM   Other Tests  Rapid HIV: negative

## 2010-10-05 NOTE — Letter (Signed)
Summary: *HSN Results Follow up  [facility]  [address] [place], [place] [address]   Phone: [phone]  Fax: [phone]      10/02/2009   [facility] [address] [place], [place]  [address]   Dear  Ms. Loretta Norris,                            ____S.Drinkard,FNP   ____D. Gore,FNP       ____B. McPherson,MD   ____V. Rankins,MD    ____E. Mulberry,MD    ____N. Daphine Deutscher, FNP  ____D. Reche Dixon, MD    ____K. Philipp Deputy, MD    __x__S. Alben Spittle, PA-C     This letter is to inform you that your recent test(s):  _______Pap Smear    ___x____Lab Test     _______X-ray    ___x____ is within acceptable limits  _______ requires a medication change  _______ requires a follow-up lab visit  _______ requires a follow-up visit with your provider   Comments: [name] still high, but did go down some.  Let us know once you have had your surgery on the right arm.  Your cholesterol looks good.        _________________________________________________________ If you have any questions, please contact our office                     Sincerely,  Tereso Newcomer PA-C [facility]

## 2010-10-05 NOTE — Assessment & Plan Note (Signed)
Summary: cpp and bp///cns   Vital Signs:  Patient profile:   40 year old female Menstrual status:  regular Height:      61 inches Weight:      193 pounds BMI:     36.60 Temp:     98.4 degrees F oral Pulse rate:   66 / minute Pulse rhythm:   regular Resp:     18 per minute BP sitting:   134 / 92  (left arm) Cuff size:   large  Vitals Entered By: Armenia Shannon (September 22, 2009 10:36 AM) CC: cpp, Hypertension Management Is Patient Diabetic? No Pain Assessment Patient in pain? no       Does patient need assistance? Functional Status Self care Ambulation Normal   CC:  cpp and Hypertension Management.  History of Present Illness: Here for CPP.  ?Lymph node:  Has seen Dr. Janee Morn.  Plans on excision Feb.  Has had high WBC over last few mos.  Needs ?LN removed and biopsied.  If path neg, will need to decide on further w/u.  Sebaceous cyst:  On face.   To be removed next week with derm (Dr. Yetta Barre).  Health maint:  Has had abnl pap in past.  She had colposcopy.  Done by Dr. Normand Sloop 2 years ago.  Says her f/u pap was normal. No vaginal discharge or odor.  Cycles fairly regular. Sexually active with one partner. Mammo done in Aug 2010 and was ok. No FHx of breast or ovarian cancer. Takes multivitamin once daily with calcium.   Hypertension History:      Just changed to lisinopril/HCT 2 days ago.  Did not take med yet today.        Positive major cardiovascular risk factors include hypertension and current tobacco user.  Negative major cardiovascular risk factors include female age less than 72 years old.    Habits & Providers  Alcohol-Tobacco-Diet     Alcohol drinks/day: <1     Tobacco Status: current     Cigarette Packs/Day: 0.25  Exercise-Depression-Behavior     Does Patient Exercise: yes     Type of exercise: walking     Times/week: 4     Have you felt down or hopeless? yes     Have you felt little pleasure in things? no     Depression Counseling: not  indicated; screening negative for depression     STD Risk: past (gonorrhea)     STD Risk Counseling: to avoid increased STD risk     Drug Use: marijuanna     Seat Belt Use: always  Problems Prior to Update: 1)  Smoker  (ICD-305.1) 2)  Urinalysis, Abnormal  (ICD-791.9) 3)  Routine Gynecological Examination  (ICD-V72.31) 4)  Preventive Health Care  (ICD-V70.0) 5)  Leukocytosis  (ICD-288.60) 6)  Essential Hypertension, Benign  (ICD-401.1) 7)  Sebaceous Cyst  (ICD-706.2) 8)  Cbc, Abnormal  (ICD-790.99) 9)  Elevated Bp Reading Without Dx Hypertension  (ICD-796.2) 10)  Lymphadenopathy, Axilla  (ICD-785.6) 11)  Allergic Rhinitis  (ICD-477.9)  Current Medications (verified): 1)  Lisinopril-Hydrochlorothiazide 10-12.5 Mg Tabs (Lisinopril-Hydrochlorothiazide) .Marland Kitchen.. 1 By Mouth Qam  Allergies (verified): 1)  ! * Aleve  Past History:  Past Surgical History: Last updated: 04/06/2009 s/p cyst excision in 2006 (benign) s/p bilat. tubal ligation  Family History: Reviewed history from 04/06/2009 and no changes required. Mom -asthma, HTN, dyslipidemia Sis - asthma MGF - CA (?) MGM - DM  Social History: Reviewed history from 04/06/2009 and no changes required. Current  Smoker (1 pack q 2-3 days since 2000) rare ETOH no drugs Single 3 kids Occupation:  Security at [employer]  STD Risk:  past (gonorrhea) Packs/Day:  0.25 Drug Use:  marijuanna Seat Belt Use:  always Does Patient Exercise:  yes  Review of Systems General:  Denies chills, fatigue, fever, and weight loss. CV:  Denies chest pain or discomfort, fainting, palpitations, shortness of breath with exertion, and swelling of feet. Resp:  Denies cough. GI:  Denies bloody stools and dark tarry stools. GU:  Denies hematuria. MS:  Denies joint pain. Derm:  Denies lesion(s). Endo:  Denies cold intolerance and heat intolerance. Heme:  Complains of enlarge lymph nodes.  Physical Exam  General:  alert, well-developed, and  well-nourished.   Head:  normocephalic and atraumatic.   Eyes:  pupils equal, pupils round, pupils reactive to light, and no retinal abnormalitiies.   Ears:  R ear normal and L ear normal.   Nose:  no external deformity.   Mouth:  pharynx pink and moist, no erythema, and no exudates.   Neck:  supple, no thyromegaly, and no cervical lymphadenopathy.   Breasts:  skin/areolae normal, no masses, no abnormal thickening, no nipple discharge, and no tenderness.  Right axilla still with firm mobile 3 mm mass concerning for solitary lymph node; left axilla nml   Lungs:  normal breath sounds, no crackles, and no wheezes.   Heart:  normal rate and regular rhythm.   Abdomen:  soft, normal bowel sounds, no masses, and no hepatomegaly.   Rectal:  no external abnormalities.   Genitalia:  normal introitus, no external lesions, no vaginal discharge, mucosa pink and moist, no vaginal or cervical lesions, no vaginal atrophy, no friaility or hemorrhage, normal uterus size and position, and no adnexal masses or tenderness.   Msk:  normal ROM.   Pulses:  DP/PT 2+ bilat  Extremities:  no edema  Neurologic:  alert & oriented X3, cranial nerves II-XII intact, strength normal in all extremities, and DTRs symmetrical and normal.   Skin:  sebaceous cyst on left face again noted  Axillary Nodes:  L axillary LN enlarged.   Psych:  Oriented X3, normally interactive, and good eye contact.     Impression & Recommendations:  Problem # 1:  Preventive Health Care (ICD-V70.0) check chol and RPR with labs in 1-2 weeks  Orders: T-HIV Antibody  (Reflex) ([id number])  Problem # 2:  ROUTINE GYNECOLOGICAL EXAMINATION (ICD-V72.31)  Orders: KOH/ WET Mount [phone]) T-Pap Smear, Thin Prep (91478) T- GC Chlamydia (29562)  Problem # 3:  URINALYSIS, ABNORMAL (ICD-791.9) just had cycle couple weeks ago ? related check micro and culture  Orders: T- * Misc. Laboratory test [phone]) T-Culture, Urine  ([id number])  Problem # 4:  SMOKER (ICD-305.1)  patient interested in nicotine patches will try  Her updated medication list for this problem includes:    Nicoderm Cq 14 Mg/24hr Pt24 (Nicotine) .Marland Kitchen... Apply once daily for 4 weeks; do not smoke with patch on    Nicoderm Cq 7 Mg/24hr Pt24 (Nicotine) .Marland Kitchen... Apply once daily after wearing the 14 mg patch for one month; continue for 2 weeks, then stop.  do not smoke with patch on.  Problem # 5:  ESSENTIAL HYPERTENSION, BENIGN (ICD-401.1) just changed meds recently did not take today f/u labs in 2 weeks  Her updated medication list for this problem includes:    Lisinopril-hydrochlorothiazide 10-12.5 Mg Tabs (Lisinopril-hydrochlorothiazide) .Marland Kitchen... 1 by mouth qam  Problem # 6:  LYMPHADENOPATHY, AXILLA (ICD-785.6) excision to  take place in Feb if path neg, may need further testing for elevated WBC  Problem # 7:  LEUKOCYTOSIS (ICD-288.60) recheck with labs next week  Complete Medication List: 1)  Lisinopril-hydrochlorothiazide 10-12.5 Mg Tabs (Lisinopril-hydrochlorothiazide) .Marland Kitchen.. 1 by mouth qam 2)  Nicoderm Cq 14 Mg/24hr Pt24 (Nicotine) .... Apply once daily for 4 weeks; do not smoke with patch on 3)  Nicoderm Cq 7 Mg/24hr Pt24 (Nicotine) .... Apply once daily after wearing the 14 mg patch for one month; continue for 2 weeks, then stop.  do not smoke with patch on.  Hypertension Assessment/Plan:      The patient's hypertensive risk group is category B: At least one risk factor (excluding diabetes) with no target organ damage.  Today's blood pressure is 134/92.  Her blood pressure goal is < 140/90.    Patient Instructions: 1)  Flu shot today. 2)  Tetanus shot today. 3)  Patient has appointment for labs in next 1-2 weeks.  Add the following labs on:  CBC, CMET, Lipids, RPR (Dx V70.0, 401.1, 288.60) 4)  Please schedule a follow-up appointment in 2 months for blood pressure and lymph node.  5)  Tobacco is very bad for your health and your  loved ones ! You should stop smoking !  6)  Stop smoking tips: Choose a quit date. Cut down before the quit date. Decide what you will do as a substitute when you feel the urge to smoke(gum, toothpick, exercise).  7)  Do not smoke with the nicotine patch on. Prescriptions: NICODERM CQ 7 MG/24HR PT24 (NICOTINE) apply once daily after wearing the 14 mg patch for one month; continue for 2 weeks, then stop.  Do not smoke with patch on.  #2 wk supply x 0   Entered and Authorized by:   Tereso Newcomer PA-C   Signed by:   Tereso Newcomer PA-C on 09/22/2009   Method used:   Faxed to ...       [facility] (retail)       [address] [place], [place]  [address]       Ph: [phone]       Fax: [phone]   RxID:   [id number] NICODERM CQ 14 MG/24HR PT24 (NICOTINE) apply once daily for 4 weeks; do not smoke with patch on  #1 mo supply x 0   Entered and Authorized by:   Tereso Newcomer PA-C   Signed by:   Tereso Newcomer PA-C on 09/22/2009   Method used:   Faxed to ...       [facility] (retail)       [address] [place], [place]  [address]       Ph: [phone]       Fax: [phone]   RxID:   [id number]   Laboratory Results   Urine Tests  Date/Time Received: September 22, 2009 10:51 AM   Routine Urinalysis   Glucose: negative   (Normal Range: Negative) Bilirubin: negative   (Normal Range: Negative) Ketone: negative   (Normal Range: Negative) Spec. Gravity: 1.020   (Normal Range: 1.003-1.035) Blood: trace-lysed   (Normal Range: Negative) pH: 6.0   (Normal Range: 5.0-8.0) Protein: negative   (Normal Range: Negative) Urobilinogen: 0.2   (Normal Range: 0-1) Nitrite: negative   (Normal Range: Negative) Leukocyte Esterace: negative   (Normal Range: Negative)    Date/Time Received: September 22, 2009 12:10 PM  Date/Time Reported: September 22, 2009 12:10 PM   [employer] Source: vaginal WBC/hpf:  1-5 Bacteria/hpf: rare Clue cells/hpf: none  Negative whiff Yeast/hpf: none Wet Mount KOH: Negative Trichomonas/hpf: none  Other Tests  Rapid HIV: negative      Laboratory Results   Urine Tests    Routine Urinalysis   Glucose: negative   (Normal Range: Negative) Bilirubin: negative   (Normal Range: Negative) Ketone: negative   (Normal Range: Negative) Spec. Gravity: 1.020   (Normal Range: 1.003-1.035) Blood: trace-lysed   (Normal Range: Negative) pH: 6.0   (Normal Range: 5.0-8.0) Protein: negative   (Normal Range: Negative) Urobilinogen: 0.2   (Normal Range: 0-1) Nitrite: negative   (Normal Range: Negative) Leukocyte Esterace: negative   (Normal Range: Negative)      Wet Mount/KOH  Negative whiff  Other Tests  Rapid HIV: negative   Appended Document: cpp and bp///cns   Tetanus/Td Vaccine    Vaccine Type: Td    Site: right deltoid    Mfr: Sanofi Pasteur    Dose: 0.5 ml    Route: IM    Given by: Armenia Shannon    Exp. Date: 11/26/2011    Lot #: [id number]    VIS given: 07/24/07 version given September 23, 2009.  Influenza Vaccine    Vaccine Type: Fluvax 3+    Site: right deltoid    Mfr: Sanofi Pasteur    Dose: 0.5 ml    Route: IM    Given by: Armenia Shannon    Exp. Date: 03/04/2010    Lot #: [id number]    VIS given: 03/29/07 version given September 23, 2009.  Flu Vaccine Consent Questions    Do you have a history of severe allergic reactions to this vaccine? no    Any prior history of allergic reactions to egg and/or gelatin? no    Do you have a sensitivity to the preservative Thimersol? no    Do you have a past history of Guillan-Barre Syndrome? no    Do you currently have an acute febrile illness? no    Have you ever had a severe reaction to latex? no    Vaccine information given and explained to patient? yes    Are you currently pregnant? no    Orders Added: 1)  TD Toxoids IM 7 YR + [90714] 2)  Flu Vaccine 109yrs + [90658] 3)  Admin 1st Vaccine  [90471] 4)  Admin of Any Addtl Vaccine [90472] 5)  Admin 1st Vaccine [facility]) [65784O] 6)  Admin of Any Addtl Vaccine [facility]) [96295M]    Appended Document: cpp and bp///cns Patient: Loretta Norris Note: All result statuses are Final unless otherwise noted.  Tests: (1) Chlamydia and GC Probe Amp, Genital (5990)  Chlamydia Probe Amp, Genital                             NEGATIVE                    NEGATIVE           Testing performed using the BD Probetec ET Chlamydia     trachomatis and Neisseria gonorrhea amplified DNA assay.  GC Probe Amp, Genital                             NEGATIVE  NEGATIVE           Testing performed using the BD Probetec ET Chlamydia     trachomatis and Neisseria gonorrhea amplified DNA assay.  Tests: (2) Urine Microscopic (65010)  Squamous Epithelial/ HPF                             FEW                         RARE   Crystals                  NONE SEEN                   NEG   Casts                     NONE SEEN                   NEG   WBC                       0-2 WBC/hpf                 <3   RBC                       0-2 RBC/hpf                 <3   Bacteria/ HPF             RARE                        RARE  Note: An exclamation mark (!) indicates a result that was not dispersed into the flowsheet. Document Creation Date: 09/24/2009 9:31 AM _______________________________________________________________________  (1) Order result status: Final Collection or observation date-time: 09/22/2009 22:25 Requested date-time: 09/22/2009 11:55 Receipt date-time: 09/22/2009 22:25 Reported date-time: 09/24/2009 09:31 Referring Physician:   Ordering Physician:  Alben Spittle [phone]) Specimen Source:  Source: Lajean Silvius Order Number: [id number] Lab site: [facility]     [address]     [place]  [place]  [address]  (2) Order result status: Final Collection or observation date-time: 09/22/2009  22:25 Requested date-time: 09/22/2009 11:55 Receipt date-time: 09/22/2009 22:25 Reported date-time: 09/24/2009 09:31 Referring Physician:   Ordering Physician:  Alben Spittle [phone]) Specimen Source:  Source: Lajean Silvius Order Number: [id number] Lab site: [facility]     [address]     [place]  [place]  [address]   -----------------  The following non-numeric lab results were dispersed to the flowsheet even though numeric results were expected:    Squamous Epithelial/ HPF, FEW   WBC, 0-2   Signed by Tereso Newcomer PA-C on 09/24/2009 at 5:02 PM  ________________________________________________________________________ no RBCs culture neg no further w/u of trace blood in urine    Signed by Tereso Newcomer PA-C on 09/24/2009 at 5:03 PM  Appended Document: cpp and bp///cns Patient: Loretta Norris Note: All result statuses are Final unless otherwise noted.  Tests: (1) CBC with Diff (10010)   Order Note: FASTING   WBC                  [H]  13.2 K/uL  4.0-10.5   RBC                       4.16 MIL/uL                 3.87-5.11   Hemoglobin                13.4 g/dL                   41.3-24.4   Hematocrit                41.3 %                      36.0-46.0   MCV                       99.3 fL                     78.0-100.0   MCHC                      32.4 g/dL                   01.0-27.2   RDW                       13.8 %                      11.5-15.5   Platelet Count            231 K/uL                    150-400   Granulocyte %             62 %                        43-77   Absolute Gran        [H]  8.2 K/uL                    1.7-7.7   Lymph %                   29 %                        12-46   Absolute Lymph            3.8 K/uL                    0.7-4.0   Mono %                    7 %                         3-12   Absolute Mono             0.9 K/uL                    0.1-1.0   Eos %                     1 %  0-5   Absolute Eos              0.2 K/uL                    0.0-0.7   Baso %                    0 %                         0-1   Absolute Baso             0.1 K/uL                    0.0-0.1   WBC Morphology       RESULT: Criteria for review not met   RBC Morphology       RESULT: Criteria for review not met   Smear Review       RESULT: Criteria for review not met  Tests: (2) Comprehensive Metabolic Panel (62130)   Sodium                    137 mEq/L                   135-145   Potassium                 4.3 mEq/L                   3.5-5.3   Chloride                  106 mEq/L                   96-112   CO2                       22 mEq/L                    19-32   Glucose                   88 mg/dL                    86-57   BUN                       13 mg/dL                    8-46   Creatinine                0.65 mg/dL                  0.40-1.20   Bilirubin, Total          0.6 mg/dL                   9.6-2.9   Alkaline Phosphatase      52 U/L                      39-117   AST/SGOT                  29 U/L                      0-37   ALT/SGPT  32 U/L                      0-35   Total Protein             6.9 g/dL                    5.6-2.1   Albumin                   4.0 g/dL                    3.0-8.6   Calcium                   9.0 mg/dL                   5.7-84.6  Tests: (3) Lipid Profile (96295)   Cholesterol               153 mg/dL                   2-841     ATP III Classification:           < 200        mg/dL        Desirable          200 - 239     mg/dL        Borderline High          >= 240        mg/dL        High         Triglyceride              92 mg/dL                    <324   HDL Cholesterol           53 mg/dL                    >40   Total Chol/HDL Ratio      2.9 Ratio  VLDL Cholesterol (Calc)                             18 mg/dL                    1-02  LDL Cholesterol (Calc)                             82 mg/dL                     7-25           Total Cholesterol/HDL Ratio:CHD Risk                            Coronary Heart Disease Risk Table                                            Men       Women              1/2 Average Risk  3.4        3.3                  Average Risk              5.0        4.4              2 X Average Risk              9.6        7.1              3 X Average Risk             23.4       11.0     Use the calculated Patient Ratio above and the CHD Risk table      to determine the patient's CHD Risk.     ATP III Classification (LDL):           < 100        mg/dL         Optimal          100 - 129     mg/dL         Near or Above Optimal          130 - 159     mg/dL         Borderline High          160 - 189     mg/dL         High           > 190        mg/dL         Very High        Tests: (4) RPR Reflex to T.pallidum Ab, Total (62130)   RPR                       NON REAC                    NON REAC  Note: An exclamation mark (!) indicates a result that was not dispersed into the flowsheet. Document Creation Date: 10/02/2009 3:48 AM _______________________________________________________________________  (1) Order result status: Final Collection or observation date-time: 10/01/2009 21:37 Requested date-time: 10/01/2009 10:51 Receipt date-time: 10/01/2009 21:37 Reported date-time: 10/02/2009 03:48 Referring Physician:   Ordering Physician:  Alben Spittle [phone]) Specimen Source:  Source: Lajean Silvius Order Number: [id number] Lab site: [facility]     [address]     [place]  [place]  [address]  (2) Order result status: Final Collection or observation date-time: 10/01/2009 21:37 Requested date-time: 10/01/2009 10:51 Receipt date-time: 10/01/2009 21:37 Reported date-time: 10/02/2009 03:48 Referring Physician:   Ordering Physician:  Alben Spittle [phone]) Specimen Source:  Source: Lajean Silvius Order Number: [id number] Lab site: [facility]     [address], [address]     [place]  [place]  [address]  (3) Order result status: Final Collection or observation date-time: 10/01/2009 21:37 Requested date-time: 10/01/2009 10:51 Receipt date-time: 10/01/2009 21:37 Reported date-time: 10/02/2009 03:48 Referring Physician:   Ordering Physician:  Alben Spittle [phone]) Specimen Source:  Source: Lajean Silvius Order Number: [id number] Lab site: [facility]     [address]     [place]  [place]  [address]  (4) Order result status: Final Collection or observation date-time: 10/01/2009 21:37 Requested  date-time: 10/01/2009 10:51 Receipt date-time: 10/01/2009 21:37 Reported date-time: 10/02/2009 03:48 Referring Physician:   Ordering Physician:  Alben Spittle [phone]) Specimen Source:  Source: Lajean Silvius Order Number: [id number] Lab site: [facility]     [address]     [place]  [place]  [address]   Signed by Tereso Newcomer PA-C on 10/02/2009 at 4:07 PM  ________________________________________________________________________ WBC still up  waiting on path on ?LN of axilla    Clinical Lists Changes  Observations: Added new observation of PSEUDOANEURY: no (10/02/2009 16:08) Added new observation of HX OF PVD: no (10/02/2009 16:08) Added new observation of HX CVA/TI: no (10/02/2009 16:08) Added new observation of HX  CAD: no (10/02/2009 16:08) Added new observation of HX OF DM: no (10/02/2009 16:08) Added new observation of BP DIASTOLIC: 92 mmHg (10/02/2009 21:30) Added new observation of BP SYSTOLIC: 134 mmHg (10/02/2009 16:08) Added new observation of CHIEF CMPLNT: Lipid Management  (10/02/2009 16:08) Added new observation of TRIG GOAL: 150 mg/dL (86/57/8469 62:95) Added new observation of HDL GOAL: 40 mg/dL (28/41/3244 01:02) Added new observation of LDL GOAL: 130 mg/dL (72/53/6644 03:47) Added new observation of CHOL GOAL:  200 mg/dL (42/59/5638 75:64) Added new observation of CHD 36YR RSK: 3 %  (10/02/2009 16:08) Added new observation of HX HDL<35: no  (10/02/2009 16:08)        Lipid Management History:      Positive NCEP/ATP III risk factors include current tobacco user and hypertension.  Negative NCEP/ATP III risk factors include female age less than 69 years old, non-diabetic, no ASHD (atherosclerotic heart disease), no prior stroke/TIA, no peripheral vascular disease, and no history of aortic aneurysm.     Lipid Assessment/Plan:      Based on NCEP/ATP III, the patient's risk factor category is "2 or more risk factors and a calculated 10 year CAD risk of < 20%".  The patient's lipid goals are as follows: Total cholesterol goal is 200; LDL cholesterol goal is 130; HDL cholesterol goal is 40; Triglyceride goal is 150.      Signed by Tereso Newcomer PA-C on 10/02/2009 at 4:10 PM

## 2010-10-05 NOTE — Letter (Signed)
Summary: PODIATRY NOTES  PODIATRY NOTES   Imported By: Arta Bruce 01/25/2010 08:50:42  _____________________________________________________________________  External Attachment:    Type:   Image     Comment:   External Document

## 2010-10-05 NOTE — Miscellaneous (Signed)
Summary: Right Axillary Mass evaluated by Dr. Janee Morn 09/02/2009  Clinical Lists Changes  Problems: Assessed LYMPHADENOPATHY, AXILLA as comment only - eval by Dr. Violeta Gelinas 09/02/2009 plan excision  Assessed LEUKOCYTOSIS as comment only - if right axillary mass path is benign, needs further w/u  Observations: Added new observation of PAST MED HX: Current Problems:  ALLERGIC RHINITIS (ICD-477.9) Right axillary mass (? lymphadenopathy); eval. by Dr. Violeta Gelinas 09/02/2009; excision planned   (09/20/2009 10:50)       Past History:  Past Medical History: Current Problems:  ALLERGIC RHINITIS (ICD-477.9) Right axillary mass (? lymphadenopathy); eval. by Dr. Violeta Gelinas 09/02/2009; excision planned   Impression & Recommendations:  Problem # 1:  LYMPHADENOPATHY, AXILLA (ICD-785.6) eval by Dr. Violeta Gelinas 09/02/2009 plan excision  Problem # 2:  LEUKOCYTOSIS (ICD-288.60) if right axillary mass path is benign, needs further w/u  Complete Medication List: 1)  Lisinopril-hydrochlorothiazide 10-12.5 Mg Tabs (Lisinopril-hydrochlorothiazide) .Marland Kitchen.. 1 by mouth qam

## 2010-10-07 NOTE — Letter (Signed)
Summary: [facility]  [facility]   Imported By: Arta Bruce 08/24/2010 15:12:19  _____________________________________________________________________  External Attachment:    Type:   Image     Comment:   External Document

## 2010-12-30 ENCOUNTER — Other Ambulatory Visit (HOSPITAL_COMMUNITY): Payer: Self-pay

## 2011-01-20 ENCOUNTER — Other Ambulatory Visit (HOSPITAL_COMMUNITY): Payer: Self-pay

## 2011-01-21 NOTE — Op Note (Signed)
Loretta Norris, Loretta Norris              ACCOUNT NO.:  [id number]   MEDICAL RECORD NO.:  [id number]          PATIENT TYPE:  AMB   LOCATION:  DSC                          FACILITY:  [facility]   PHYSICIAN:  Gabrielle Dare. Janee Morn, M.D.DATE OF BIRTH:  [birth year]   DATE OF PROCEDURE:  06/20/2005  DATE OF DISCHARGE:                                 OPERATIVE REPORT   PREOPERATIVE DIAGNOSIS:  Cyst, left lower extremity.   POSTOPERATIVE DIAGNOSIS:  Cyst, left lower extremity.   PROCEDURE:  Excision of cyst, left lower extremity.   SURGEON:  Gabrielle Dare. Janee Morn, M.D.   ANESTHESIA:  General.   HISTORY OF PRESENT ILLNESS:  Patient is a 40 year old African-American  female who I evaluated in the office for a cyst on her left anterior medial  shin area.  This is likely an epidermal inclusion cyst.  She presents for  elective excision.  Due to the size of it and the nature of the soft tissue  in her anterior tibial region, I advised her it would not likely to be  completely closed and need to heal partially by secondary intention.  She  has been instructed in wet-to-dry dressings preoperatively.   PROCEDURE IN DETAIL:  Informed consent was obtained.  The patient received  intravenous antibiotics.  She was identified, and the area was marked.  She  was brought to the operating room, and general anesthesia was administered.  Her left lower extremity was prepped and draped in a sterile fashion.  Then  0.5% Marcaine with epinephrine was injected for a local anesthetic.  An  elliptical incision was made to encompass the cyst area.  The overlying skin  was also excised, as it has been damaged from chronic inflammation and  intermittent drainage from the cyst.  Subcutaneous tissues were dissected,  and the cyst was removed completely in one piece and sent to pathology.  Subcutaneous tissues were cauterized to get excellent hemostasis.  The wound  was irrigated.  Some additional local anesthetic was injected.  Subsequently, the subcutaneous tissues were approximated with interrupted 2-  0 Vicryl sutures.  This nearly closed the wound, but there was probably  about a 7 mm gap.  The wound was oriented in a vertical fashion.  A primary  wet-to-dry dressing was placed.  Sponge, needle, and instrument counts were  correct.  The patient was taken to the recovery room in stable condition  after tolerating the procedure without any apparent complications.      Gabrielle Dare Janee Morn, M.D.  Electronically Signed     BET/MEDQ  D:  06/20/2005  T:  06/20/2005  Job:  [id number]

## 2011-01-26 ENCOUNTER — Ambulatory Visit (HOSPITAL_COMMUNITY): Admission: RE | Admit: 2011-01-26 | Payer: Self-pay | Source: Ambulatory Visit | Admitting: Obstetrics and Gynecology

## 2013-09-30 ENCOUNTER — Other Ambulatory Visit: Payer: Self-pay | Admitting: Obstetrics and Gynecology

## 2014-03-18 ENCOUNTER — Other Ambulatory Visit: Payer: Self-pay | Admitting: Obstetrics and Gynecology

## 2014-04-08 ENCOUNTER — Other Ambulatory Visit (HOSPITAL_COMMUNITY): Payer: Self-pay | Admitting: Obstetrics and Gynecology

## 2014-04-08 ENCOUNTER — Encounter (HOSPITAL_COMMUNITY): Payer: Self-pay | Admitting: Pharmacist

## 2014-04-08 NOTE — H&P (Signed)
Loretta Norris is a 43 y.o.  female P 3-0-1-3 for hysteroscopy, dilatation and curettage because of menorrhagia. For many years the patient has suffered with menorrhagia that has only worsened in the past year. She bleeds for 5-7 days with the change of double proctection 5 times a day.  In spite of this, she will soil her clothes or linen on occasion.  She goes on to report cramping that is rated at 10/10 on a 10 point pain scale that is decreased to 4/10 with Ibuprofen 600 mg.  She denies inter-menstrual bleeding, post-coital bleeding, changes in bowel or bladder function though she feels like she doesn't completely empty her rectum with bowel movements.  In January 2015 a pelvic ultrasound/sono-hysterogram showed a uterus-9.01 x 6.23 x 5.89 cm; endometrium-1.8 mm; anterior intramural fibroid 2.3 x 2.0 x 2.2 cm with a posterior myometrium that is enlarged and heterogenous-suggestive of adenomyosis.  There were no focal lesions seen with saline infusion. An endometrial biopsy done at that same time returned benign findings. A TSH and CBC were normal except that H/H = 11.4/35.5. The patient tried Lysteda for her symptoms but results were minimal. A review of both medical and surgical management options were given to the patient however, she now wants to proceed with surgical evaluation and management.   Past Medical History  OB History: G: 4;  P 3-0-1-3;  SVB: 1989, 1991 and 1993  GYN History: menarche: 43 YO;    LMP: 03/14/2014    Contracepton bilateral tubal ligation  The patient reports a past history of: chlamydia, herpes and trichomonas.  Denies history of abnormal PAP smear;   Last PAP smear: 07/2013-normal  Medical History: Hypertension  Surgical History: [place]  Tubal Sterilization Denies problems with anesthesia or history of blood transfusions  Family History: Diabetes Mellitus, Uterine Fibroids, Asthma, Hypertension, Cardiovascular Disease, Cancer  Social History: Single and  Employed in [place];  Smokes 1/2 PPD and occasionally consumes alcohol   Outpatient Encounter Prescriptions as of 04/08/2014  Medication Sig  . lisinopril-hydrochlorothiazide (PRINZIDE,ZESTORETIC) 20-12.5 MG per tablet Take 1 tablet by mouth daily.  Marland Kitchen OVER THE COUNTER MEDICATION Take 8.6 mg by mouth daily. Vegetable Laxative  . tranexamic acid (LYSTEDA) 650 MG TABS tablet Take 1,300 mg by mouth 2 (two) times daily as needed.  . triamcinolone cream (KENALOG) 0.1 % Apply 1 application topically 2 (two) times daily as needed. For Eczema  Vitamin D3  daily  Allergies  Allergen Reactions  . Naproxen Sodium Hives and Itching    Can tolerate Ibuprofen OKI    ROS:   Denies: corrective lenses, headache, vision changes, nasal congestion, dysphagia, tinnitus, dizziness, hoarseness, cough,  chest pain, shortness of breath, nausea, vomiting, diarrhea,constipation,  urinary frequency, urgency  dysuria, hematuria, vaginitis symptoms, pelvic pain, swelling of joints,easy bruising,  myalgias, arthralgias, skin rashes, unexplained weight loss and except as is mentioned in the history of present illness, patient's review of systems is otherwise negative.   Physical Exam  Bp:112/66  P: 80   R: 18  Temperature: 97.8 degrees F orally    Weight: 182 lbs.   Height: 5\' 1"    BMI: 34.4  Neck: supple without masses or thyromegaly Lungs: clear to auscultation Heart: regular rate and rhythm Abdomen: soft, non-tender and no organomegaly Pelvic:EGBUS- wnl; vagina-normal rugae; uterus-normal size, cervix without lesions or motion tenderness; adnexae-no tenderness or masses Extremities:  no clubbing, cyanosis or edema   Assesment: Menorrhagia   Disposition:  A  discussion was held with patient regarding the indication for her procedure(s) along with the risks, which include but are not limited to: reaction to anesthesia, damage to adjacent organs, infection and excessive bleeding. The patient verbalized  understanding of these risks and wants to proceed with Hysteroscopy, Dilatation, Curettage and Endometrial Ablation at [place] [facility] on April 17, 2014 at 1:15 p.m.  CSN# [id number]   Leeam Cedrone J. Florene Glen, PA-C  for Dr. Franklyn Lor. Dillard

## 2014-04-15 ENCOUNTER — Encounter (HOSPITAL_COMMUNITY): Payer: Self-pay

## 2014-04-15 ENCOUNTER — Encounter (HOSPITAL_COMMUNITY)
Admission: RE | Admit: 2014-04-15 | Discharge: 2014-04-15 | Disposition: A | Discharge status: Home / Self Care | Payer: BC Managed Care – PPO | Source: Ambulatory Visit | Attending: Obstetrics and Gynecology | Admitting: Obstetrics and Gynecology

## 2014-04-15 DIAGNOSIS — N926 Irregular menstruation, unspecified: Secondary | ICD-10-CM | POA: Diagnosis not present

## 2014-04-15 DIAGNOSIS — Z8249 Family history of ischemic heart disease and other diseases of the circulatory system: Secondary | ICD-10-CM | POA: Diagnosis not present

## 2014-04-15 DIAGNOSIS — F17200 Nicotine dependence, unspecified, uncomplicated: Secondary | ICD-10-CM | POA: Diagnosis not present

## 2014-04-15 DIAGNOSIS — N920 Excessive and frequent menstruation with regular cycle: Secondary | ICD-10-CM | POA: Diagnosis present

## 2014-04-15 DIAGNOSIS — Z825 Family history of asthma and other chronic lower respiratory diseases: Secondary | ICD-10-CM | POA: Diagnosis not present

## 2014-04-15 DIAGNOSIS — Z833 Family history of diabetes mellitus: Secondary | ICD-10-CM | POA: Diagnosis not present

## 2014-04-15 DIAGNOSIS — I10 Essential (primary) hypertension: Secondary | ICD-10-CM | POA: Diagnosis not present

## 2014-04-15 DIAGNOSIS — D251 Intramural leiomyoma of uterus: Secondary | ICD-10-CM | POA: Diagnosis not present

## 2014-04-15 HISTORY — DX: Essential (primary) hypertension: I10

## 2014-04-15 LAB — BASIC METABOLIC PANEL
Anion gap: 10 (ref 5–15)
BUN: 14 mg/dL (ref 6–23)
CO2: 24 mEq/L (ref 19–32)
Calcium: 9 mg/dL (ref 8.4–10.5)
Chloride: 103 mEq/L (ref 96–112)
Creatinine, Ser: 0.77 mg/dL (ref 0.50–1.10)
GFR calc Af Amer: >90 mL/min (ref >90)
GFR calc non Af Amer: >90 mL/min (ref >90)
Glucose, Bld: 85 mg/dL (ref 70–99)
Potassium: 3.5 mEq/L — ABNORMAL LOW (ref 3.7–5.3)
Sodium: 137 mEq/L (ref 137–147)

## 2014-04-15 LAB — CBC
HCT: 36 % (ref 36.0–46.0)
Hemoglobin: 11.8 g/dL — ABNORMAL LOW (ref 12.0–15.0)
MCH: 30.3 pg (ref 26.0–34.0)
MCHC: 32.8 g/dL (ref 30.0–36.0)
MCV: 92.3 fL (ref 78.0–100.0)
Platelets: 270 10*3/uL (ref 150–400)
RBC: 3.9 MIL/uL (ref 3.87–5.11)
RDW: 16.7 % — ABNORMAL HIGH (ref 11.5–15.5)
WBC: 11.8 10*3/uL — ABNORMAL HIGH (ref 4.0–10.5)

## 2014-04-15 NOTE — Pre-Procedure Instructions (Signed)
EKG reviewed and accepted by Dr Jillyn Hidden. No orders given.

## 2014-04-15 NOTE — Patient Instructions (Signed)
[place]  04/15/2014   Your procedure is scheduled on:  04/17/14  Enter through the Main Entrance of [facility] at [place] up the phone at the desk and dial [phone].   Call this number if you have problems the morning of surgery: [phone]   Remember:   Do not eat food:After Midnight.  Do not drink clear liquids: 4 Hours before arrival.  Take these medicines the morning of surgery with A SIP OF WATER: blood pressure medication   Do not wear jewelry, make-up or nail polish.  Do not wear lotions, powders, or perfumes. You may wear deodorant.  Do not shave 48 hours prior to surgery.  Do not bring valuables to the hospital.  [facility] is not   responsible for any belongings or valuables brought to the hospital.  Contacts, dentures or bridgework may not be worn into surgery.  Leave suitcase in the car. After surgery it may be brought to your room.  For patients admitted to the hospital, checkout time is 11:00 AM the day of              discharge.   Patients discharged the day of surgery will not be allowed to drive             home.  Name and phone number of your driver: daughter  Sintia Mckissic  Special Instructions:      Please read over the following fact sheets that you were given:   Surgical Site Infection Prevention

## 2014-04-17 ENCOUNTER — Ambulatory Visit (HOSPITAL_COMMUNITY)
Admission: RE | Admit: 2014-04-17 | Discharge: 2014-04-17 | Disposition: A | Discharge status: Home / Self Care | Payer: BC Managed Care – PPO | Source: Ambulatory Visit | Attending: Obstetrics and Gynecology | Admitting: Obstetrics and Gynecology

## 2014-04-17 ENCOUNTER — Encounter (HOSPITAL_COMMUNITY): Payer: Self-pay | Admitting: Anesthesiology

## 2014-04-17 ENCOUNTER — Encounter (HOSPITAL_COMMUNITY)
Admission: RE | Disposition: A | Discharge status: Home / Self Care | Payer: Self-pay | Source: Ambulatory Visit | Attending: Obstetrics and Gynecology

## 2014-04-17 ENCOUNTER — Ambulatory Visit (HOSPITAL_COMMUNITY): Payer: BC Managed Care – PPO | Admitting: Anesthesiology

## 2014-04-17 ENCOUNTER — Encounter (HOSPITAL_COMMUNITY): Payer: BC Managed Care – PPO | Admitting: Anesthesiology

## 2014-04-17 DIAGNOSIS — Z825 Family history of asthma and other chronic lower respiratory diseases: Secondary | ICD-10-CM | POA: Insufficient documentation

## 2014-04-17 DIAGNOSIS — N920 Excessive and frequent menstruation with regular cycle: Secondary | ICD-10-CM | POA: Diagnosis not present

## 2014-04-17 DIAGNOSIS — N921 Excessive and frequent menstruation with irregular cycle: Secondary | ICD-10-CM

## 2014-04-17 DIAGNOSIS — Z8249 Family history of ischemic heart disease and other diseases of the circulatory system: Secondary | ICD-10-CM | POA: Insufficient documentation

## 2014-04-17 DIAGNOSIS — I10 Essential (primary) hypertension: Secondary | ICD-10-CM | POA: Insufficient documentation

## 2014-04-17 DIAGNOSIS — Z833 Family history of diabetes mellitus: Secondary | ICD-10-CM | POA: Insufficient documentation

## 2014-04-17 DIAGNOSIS — D251 Intramural leiomyoma of uterus: Secondary | ICD-10-CM | POA: Insufficient documentation

## 2014-04-17 DIAGNOSIS — N926 Irregular menstruation, unspecified: Secondary | ICD-10-CM | POA: Insufficient documentation

## 2014-04-17 DIAGNOSIS — F17200 Nicotine dependence, unspecified, uncomplicated: Secondary | ICD-10-CM | POA: Insufficient documentation

## 2014-04-17 HISTORY — PX: DILITATION & CURRETTAGE/HYSTROSCOPY WITH THERMACHOICE ABLATION: SHX5569

## 2014-04-17 LAB — PREGNANCY, URINE: Preg Test, Ur: NEGATIVE

## 2014-04-17 SURGERY — DILATATION & CURETTAGE/HYSTEROSCOPY WITH THERMACHOICE ABLATION
Anesthesia: General | Site: Vagina

## 2014-04-17 MED ORDER — PROPOFOL 10 MG/ML IV EMUL
INTRAVENOUS | Status: AC
  Filled 2014-04-17: qty 40

## 2014-04-17 MED ORDER — PHENYLEPHRINE 40 MCG/ML (10ML) SYRINGE FOR IV PUSH (FOR BLOOD PRESSURE SUPPORT)
PREFILLED_SYRINGE | INTRAVENOUS | Status: AC
  Filled 2014-04-17: qty 5

## 2014-04-17 MED ORDER — FENTANYL CITRATE 0.05 MG/ML IJ SOLN
INTRAMUSCULAR | Status: AC
  Filled 2014-04-17: qty 5

## 2014-04-17 MED ORDER — DEXAMETHASONE SODIUM PHOSPHATE 10 MG/ML IJ SOLN
INTRAMUSCULAR | Status: AC
  Filled 2014-04-17: qty 1

## 2014-04-17 MED ORDER — MIDAZOLAM HCL 2 MG/2ML IJ SOLN
INTRAMUSCULAR | Status: DC | PRN
  Administered 2014-04-17: 2 mg via INTRAVENOUS

## 2014-04-17 MED ORDER — PROPOFOL 10 MG/ML IV BOLUS
INTRAVENOUS | Status: DC | PRN
  Administered 2014-04-17: 200 mg via INTRAVENOUS

## 2014-04-17 MED ORDER — FENTANYL CITRATE 0.05 MG/ML IJ SOLN
INTRAMUSCULAR | Status: DC | PRN
  Administered 2014-04-17 (×2): 50 ug via INTRAVENOUS

## 2014-04-17 MED ORDER — OXYCODONE HCL 5 MG/5ML PO SOLN: 5.0000 mg | Freq: Once | ORAL | Status: AC | PRN

## 2014-04-17 MED ORDER — PHENYLEPHRINE HCL 10 MG/ML IJ SOLN
INTRAMUSCULAR | Status: DC | PRN
  Administered 2014-04-17 (×2): 40 ug via INTRAVENOUS

## 2014-04-17 MED ORDER — FENTANYL CITRATE 0.05 MG/ML IJ SOLN
INTRAMUSCULAR | Status: AC
  Administered 2014-04-17: 50 ug via INTRAVENOUS
  Filled 2014-04-17: qty 2

## 2014-04-17 MED ORDER — GLYCOPYRROLATE 0.2 MG/ML IJ SOLN
INTRAMUSCULAR | Status: DC | PRN
  Administered 2014-04-17 (×2): 0.1 mg via INTRAVENOUS

## 2014-04-17 MED ORDER — LIDOCAINE HCL (CARDIAC) 20 MG/ML IV SOLN
INTRAVENOUS | Status: DC | PRN
  Administered 2014-04-17: 80 mg via INTRAVENOUS

## 2014-04-17 MED ORDER — ONDANSETRON HCL 4 MG/2ML IJ SOLN
INTRAMUSCULAR | Status: DC | PRN
  Administered 2014-04-17: 4 mg via INTRAVENOUS

## 2014-04-17 MED ORDER — ACETAMINOPHEN 325 MG PO TABS: 325.0000 mg | ORAL_TABLET | ORAL | Status: DC | PRN

## 2014-04-17 MED ORDER — MIDAZOLAM HCL 2 MG/2ML IJ SOLN
INTRAMUSCULAR | Status: AC
  Filled 2014-04-17: qty 2

## 2014-04-17 MED ORDER — LIDOCAINE HCL 2 % IJ SOLN
INTRAMUSCULAR | Status: AC
  Filled 2014-04-17: qty 20

## 2014-04-17 MED ORDER — DOXYCYCLINE HYCLATE 50 MG PO CAPS: 100.0000 mg | ORAL_CAPSULE | Freq: Two times a day (BID) | ORAL | Status: AC

## 2014-04-17 MED ORDER — HYDROCODONE-ACETAMINOPHEN 5-325 MG PO TABS: 1.0000 | ORAL_TABLET | Freq: Four times a day (QID) | ORAL | Status: DC | PRN

## 2014-04-17 MED ORDER — 0.9 % SODIUM CHLORIDE (POUR BTL) OPTIME
TOPICAL | Status: DC | PRN
  Administered 2014-04-17: 1000 mL

## 2014-04-17 MED ORDER — LIDOCAINE HCL (CARDIAC) 20 MG/ML IV SOLN
INTRAVENOUS | Status: AC
  Filled 2014-04-17: qty 5

## 2014-04-17 MED ORDER — LACTATED RINGERS IV SOLN
INTRAVENOUS | Status: DC
  Administered 2014-04-17 (×2): via INTRAVENOUS

## 2014-04-17 MED ORDER — ONDANSETRON HCL 4 MG/2ML IJ SOLN: 4.0000 mg | Freq: Once | INTRAMUSCULAR | Status: DC | PRN

## 2014-04-17 MED ORDER — OXYCODONE HCL 5 MG PO TABS
ORAL_TABLET | ORAL | Status: AC
  Administered 2014-04-17: 5 mg via ORAL
  Filled 2014-04-17: qty 1

## 2014-04-17 MED ORDER — MEPERIDINE HCL 25 MG/ML IJ SOLN: 6.2500 mg | INTRAMUSCULAR | Status: DC | PRN

## 2014-04-17 MED ORDER — LIDOCAINE HCL 2 % IJ SOLN
INTRAMUSCULAR | Status: DC | PRN
  Administered 2014-04-17: 20 mL

## 2014-04-17 MED ORDER — GLYCOPYRROLATE 0.2 MG/ML IJ SOLN
INTRAMUSCULAR | Status: AC
  Filled 2014-04-17: qty 1

## 2014-04-17 MED ORDER — DEXTROSE 5 % IV SOLN
INTRAVENOUS | Status: DC | PRN
  Administered 2014-04-17: 1000 mL via INTRAVENOUS

## 2014-04-17 MED ORDER — SCOPOLAMINE 1 MG/3DAYS TD PT72
1.0000 | MEDICATED_PATCH | Freq: Once | TRANSDERMAL | Status: DC
  Administered 2014-04-17: 1.5 mg via TRANSDERMAL

## 2014-04-17 MED ORDER — ACETAMINOPHEN 160 MG/5ML PO SOLN: 325.0000 mg | ORAL | Status: DC | PRN

## 2014-04-17 MED ORDER — DEXAMETHASONE SODIUM PHOSPHATE 4 MG/ML IJ SOLN
INTRAMUSCULAR | Status: DC | PRN
  Administered 2014-04-17: 4 mg via INTRAVENOUS

## 2014-04-17 MED ORDER — ONDANSETRON HCL 4 MG/2ML IJ SOLN
INTRAMUSCULAR | Status: AC
  Filled 2014-04-17: qty 2

## 2014-04-17 MED ORDER — FENTANYL CITRATE 0.05 MG/ML IJ SOLN
25.0000 ug | INTRAMUSCULAR | Status: DC | PRN
  Administered 2014-04-17 (×2): 50 ug via INTRAVENOUS

## 2014-04-17 MED ORDER — SCOPOLAMINE 1 MG/3DAYS TD PT72
MEDICATED_PATCH | TRANSDERMAL | Status: AC
  Filled 2014-04-17: qty 1

## 2014-04-17 MED ORDER — OXYCODONE HCL 5 MG PO TABS
5.0000 mg | ORAL_TABLET | Freq: Once | ORAL | Status: AC | PRN
  Administered 2014-04-17: 5 mg via ORAL

## 2014-04-17 MED ORDER — KETOROLAC TROMETHAMINE 30 MG/ML IJ SOLN
INTRAMUSCULAR | Status: AC
  Filled 2014-04-17: qty 1

## 2014-04-17 SURGICAL SUPPLY — 23 items
CANISTER SUCT 3000ML (MISCELLANEOUS) ×3 IMPLANT
CATH ROBINSON RED A/P 16FR (CATHETERS) ×3 IMPLANT
CATH THERMACHOICE III (CATHETERS) ×3 IMPLANT
CLOTH BEACON ORANGE TIMEOUT ST (SAFETY) ×3 IMPLANT
CONTAINER PREFILL 10% NBF 60ML (FORM) ×6 IMPLANT
DRAPE HYSTEROSCOPY (DRAPE) ×3 IMPLANT
DRSG TELFA 3X8 NADH (GAUZE/BANDAGES/DRESSINGS) ×3 IMPLANT
GLOVE BIO SURGEON STRL SZ 6.5 (GLOVE) ×2 IMPLANT
GLOVE BIO SURGEONS STRL SZ 6.5 (GLOVE) ×1
GLOVE BIOGEL PI IND STRL 7.0 (GLOVE) ×2 IMPLANT
GLOVE BIOGEL PI INDICATOR 7.0 (GLOVE) ×4
GOWN STRL REUS W/TWL LRG LVL3 (GOWN DISPOSABLE) ×6 IMPLANT
NEEDLE SPNL 22GX3.5 QUINCKE BK (NEEDLE) ×3 IMPLANT
PACK VAGINAL MINOR WOMEN LF (CUSTOM PROCEDURE TRAY) ×3 IMPLANT
PAD DRESSING TELFA 3X8 NADH (GAUZE/BANDAGES/DRESSINGS) ×1 IMPLANT
PAD OB MATERNITY 4.3X12.25 (PERSONAL CARE ITEMS) ×3 IMPLANT
SET TUBING HYSTEROSCOPY 2 NDL (TUBING) IMPLANT
SYRINGE 20CC LL (MISCELLANEOUS) ×3 IMPLANT
SYRINGE CONTROL L 12CC (SYRINGE) ×3 IMPLANT
SYRINGE CONTROL LL 12CC (SYRINGE) ×1 IMPLANT
TOWEL OR 17X24 6PK STRL BLUE (TOWEL DISPOSABLE) ×6 IMPLANT
TUBE HYSTEROSCOPY W Y-CONNECT (TUBING) IMPLANT
WATER STERILE IRR 1000ML POUR (IV SOLUTION) ×3 IMPLANT

## 2014-04-17 NOTE — H&P (View-Only) (Signed)
Loretta Norris is a 43 y.o.  female P 3-0-1-3 for hysteroscopy, dilatation and curettage because of menorrhagia. For many years the patient has suffered with menorrhagia that has only worsened in the past year. She bleeds for 5-7 days with the change of double proctection 5 times a day.  In spite of this, she will soil her clothes or linen on occasion.  She goes on to report cramping that is rated at 10/10 on a 10 point pain scale that is decreased to 4/10 with Ibuprofen 600 mg.  She denies inter-menstrual bleeding, post-coital bleeding, changes in bowel or bladder function though she feels like she doesn't completely empty her rectum with bowel movements.  In January 2015 a pelvic ultrasound/sono-hysterogram showed a uterus-9.01 x 6.23 x 5.89 cm; endometrium-1.8 mm; anterior intramural fibroid 2.3 x 2.0 x 2.2 cm with a posterior myometrium that is enlarged and heterogenous-suggestive of adenomyosis.  There were no focal lesions seen with saline infusion. An endometrial biopsy done at that same time returned benign findings. A TSH and CBC were normal except that H/H = 11.4/35.5. The patient tried Lysteda for her symptoms but results were minimal. A review of both medical and surgical management options were given to the patient however, she now wants to proceed with surgical evaluation and management.   Past Medical History  OB History: G: 4;  P 3-0-1-3;  SVB: 1989, 1991 and 1993  GYN History: menarche: 43 YO;    LMP: 03/14/2014    Contracepton bilateral tubal ligation  The patient reports a past history of: chlamydia, herpes and trichomonas.  Denies history of abnormal PAP smear;   Last PAP smear: 07/2013-normal  Medical History: Hypertension  Surgical History: [place]  Tubal Sterilization Denies problems with anesthesia or history of blood transfusions  Family History: Diabetes Mellitus, Uterine Fibroids, Asthma, Hypertension, Cardiovascular Disease, Cancer  Social History: Single and  Employed in [place];  Smokes 1/2 PPD and occasionally consumes alcohol   Outpatient Encounter Prescriptions as of 04/08/2014  Medication Sig  . lisinopril-hydrochlorothiazide (PRINZIDE,ZESTORETIC) 20-12.5 MG per tablet Take 1 tablet by mouth daily.  Marland Kitchen OVER THE COUNTER MEDICATION Take 8.6 mg by mouth daily. Vegetable Laxative  . tranexamic acid (LYSTEDA) 650 MG TABS tablet Take 1,300 mg by mouth 2 (two) times daily as needed.  . triamcinolone cream (KENALOG) 0.1 % Apply 1 application topically 2 (two) times daily as needed. For Eczema  Vitamin D3  daily  Allergies  Allergen Reactions  . Naproxen Sodium Hives and Itching    Can tolerate Ibuprofen OKI    ROS:   Denies: corrective lenses, headache, vision changes, nasal congestion, dysphagia, tinnitus, dizziness, hoarseness, cough,  chest pain, shortness of breath, nausea, vomiting, diarrhea,constipation,  urinary frequency, urgency  dysuria, hematuria, vaginitis symptoms, pelvic pain, swelling of joints,easy bruising,  myalgias, arthralgias, skin rashes, unexplained weight loss and except as is mentioned in the history of present illness, patient's review of systems is otherwise negative.   Physical Exam  Bp:112/66  P: 80   R: 18  Temperature: 97.8 degrees F orally    Weight: 182 lbs.   Height: 5\' 1"    BMI: 34.4  Neck: supple without masses or thyromegaly Lungs: clear to auscultation Heart: regular rate and rhythm Abdomen: soft, non-tender and no organomegaly Pelvic:EGBUS- wnl; vagina-normal rugae; uterus-normal size, cervix without lesions or motion tenderness; adnexae-no tenderness or masses Extremities:  no clubbing, cyanosis or edema   Assesment: Menorrhagia   Disposition:  A  discussion was held with patient regarding the indication for her procedure(s) along with the risks, which include but are not limited to: reaction to anesthesia, damage to adjacent organs, infection and excessive bleeding. The patient verbalized  understanding of these risks and wants to proceed with Hysteroscopy, Dilatation, Curettage and Endometrial Ablation at [facility] on April 17, 2014 at 1:15 p.m.  CSN# [id number]   Nikai Quest J. Florene Glen, PA-C  for Dr. Franklyn Lor. Dillard

## 2014-04-17 NOTE — Interval H&P Note (Signed)
History and Physical Interval Note:  04/17/2014 1:42 PM  Loretta Norris  has presented today for surgery, with the diagnosis of Menorrhagia, Irregular cycles  The various methods of treatment have been discussed with the patient and family. After consideration of risks, benefits and other options for treatment, the patient has consented to  Procedure(s): DILATATION & CURETTAGE/HYSTEROSCOPY WITH THERMACHOICE ABLATION (N/A) as a surgical intervention .  The patient's history has been reviewed, patient examined, no change in status, stable for surgery.  I have reviewed the patient's chart and labs.  Questions were answered to the patient's satisfaction.     [facility]

## 2014-04-17 NOTE — Discharge Instructions (Signed)
Endometrial Ablation Endometrial ablation removes the lining of the uterus (endometrium). It is usually a same-day, outpatient treatment. Ablation helps avoid major surgery, such as surgery to remove the cervix and uterus (hysterectomy). After endometrial ablation, you will have little or no menstrual bleeding and may not be able to have children. However, if you are premenopausal, you will need to use a reliable method of birth control following the procedure because of the small chance that pregnancy can occur. There are different reasons to have this procedure, which include:  Heavy periods.  Bleeding that is causing anemia.  Irregular bleeding.  Bleeding fibroids on the lining inside the uterus if they are smaller than 3 centimeters. This procedure should not be done if:  You want children in the future.  You have severe cramps with your menstrual period.  You have precancerous or cancerous cells in your uterus.  You were recently pregnant.  You have gone through menopause.  You have had major surgery on the uterus, such as a cesarean delivery. LET [facility] CARE PROVIDER KNOW ABOUT:  Any allergies you have.  All medicines you are taking, including vitamins, herbs, eye drops, creams, and over-the-counter medicines.  Previous problems you or members of your family have had with the use of anesthetics.  Any blood disorders you have.  Previous surgeries you have had.  Medical conditions you have. RISKS AND COMPLICATIONS  Generally, this is a safe procedure. However, as with any procedure, complications can occur. Possible complications include:  Perforation of the uterus.  Bleeding.  Infection of the uterus, bladder, or vagina.  Injury to surrounding organs.  An air bubble to the lung (air embolus).  Pregnancy following the procedure.  Failure of the procedure to help the problem, requiring hysterectomy.  Decreased ability to diagnose cancer in the lining of  the uterus. BEFORE THE PROCEDURE  The lining of the uterus must be tested to make sure there is no pre-cancerous or cancer cells present.  An ultrasound may be performed to look at the size of the uterus and to check for abnormalities.  Medicines may be given to thin the lining of the uterus. PROCEDURE  During the procedure, your health care provider will use a tool called a resectoscope to help see inside your uterus. There are different ways to remove the lining of your uterus.   Radiofrequency - This method uses a radiofrequency-alternating electric current to remove the lining of the uterus.  Cryotherapy - This method uses extreme cold to freeze the lining of the uterus.  Heated-Free Liquid - This method uses heated salt (saline) solution to remove the lining of the uterus.  Microwave - This method uses high-energy microwaves to heat up the lining of the uterus to remove it.  Thermal balloon - This method involves inserting a catheter with a balloon tip into the uterus. The balloon tip is filled with heated fluid to remove the lining of the uterus. AFTER THE PROCEDURE  After your procedure, do not have sexual intercourse or insert anything into your vagina until permitted by your health care provider. After the procedure, you may experience:  Cramps.  Vaginal discharge.  Frequent urination. Document Released: 07/01/2004 Document Revised: 04/24/2013 Document Reviewed: 01/23/2013 [facility] Patient Information 2015 [place], [place]. This information is not intended to replace advice given to you by your health care provider. Make sure you discuss any questions you have with your health care provider.  DISCHARGE INSTRUCTIONS: HYSTEROSCOPY / ENDOMETRIAL ABLATION The following instructions have been prepared  to help you care for yourself upon your return home.  Personal hygiene:  Use sanitary pads for vaginal drainage, not tampons.  Shower the day after your procedure.  NO tub  baths, pools or Jacuzzis for 2-3 weeks.  Wipe front to back after using the bathroom.  Activity and limitations:  Do NOT drive or operate any equipment for 24 hours. The effects of anesthesia are still present and drowsiness may result.  Do NOT rest in bed all day.  Walking is encouraged.  Walk up and down stairs slowly.  You may resume your normal activity in one to two days or as indicated by your physician. Sexual activity: NO intercourse for at least 2 weeks after the procedure, or as indicated by your Doctor.  Diet: Eat a light meal as desired this evening. You may resume your usual diet tomorrow.  Return to Work: You may resume your work activities in one to two days or as indicated by Marine scientist.  What to expect after your surgery: Expect to have vaginal bleeding/discharge for 2-3 days and spotting for up to 10 days. It is not unusual to have soreness for up to 1-2 weeks. You may have a slight burning sensation when you urinate for the first day. Mild cramps may continue for a couple of days. You may have a regular period in 2-6 weeks.  Call your doctor for any of the following:  Excessive vaginal bleeding or clotting, saturating and changing one pad every hour.  Inability to urinate 6 hours after discharge from hospital.  Pain not relieved by pain medication.  Fever of 100.4 F or greater.  Unusual vaginal discharge or odor.  Return to office _________________Call for an appointment ___________________ Patients signature: ______________________ Nurses signature ________________________  [place] [address] [phone]

## 2014-04-17 NOTE — Anesthesia Preprocedure Evaluation (Signed)
Anesthesia Evaluation  Patient identified by MRN, date of birth, ID band Patient awake    Reviewed: Allergy & Precautions, H&P , NPO status , Patient's Chart, lab work & pertinent test results  Airway Mallampati: I TM Distance: >3 FB Neck ROM: full    Dental no notable dental hx. (+) Teeth Intact   Pulmonary Current Smoker,    Pulmonary exam normal       Cardiovascular hypertension, Pt. on medications     Neuro/Psych negative neurological ROS     GI/Hepatic negative GI ROS, Neg liver ROS,   Endo/Other  negative endocrine ROS  Renal/GU negative Renal ROS     Musculoskeletal   Abdominal Normal abdominal exam  (+)   Peds  Hematology negative hematology ROS (+)   Anesthesia Other Findings   Reproductive/Obstetrics negative OB ROS                           Anesthesia Physical Anesthesia Plan  ASA: II  Anesthesia Plan: General   Post-op Pain Management:    Induction: Intravenous  Airway Management Planned: LMA  Additional Equipment:   Intra-op Plan:   Post-operative Plan:   Informed Consent: I have reviewed the patients History and Physical, chart, labs and discussed the procedure including the risks, benefits and alternatives for the proposed anesthesia with the patient or authorized representative who has indicated his/her understanding and acceptance.     Plan Discussed with: CRNA and Surgeon  Anesthesia Plan Comments:         Anesthesia Quick Evaluation

## 2014-04-17 NOTE — Transfer of Care (Signed)
Immediate Anesthesia Transfer of Care Note  Patient: Loretta Norris  Procedure(s) Performed: Procedure(s): DILATATION & CURETTAGE/HYSTEROSCOPY WITH THERMACHOICE ABLATION (N/A)  Patient Location: PACU  Anesthesia Type:General  Level of Consciousness: awake, alert  and oriented  Airway & Oxygen Therapy: Patient Spontanous Breathing and Patient connected to nasal cannula oxygen  Post-op Assessment: Report given to PACU RN and Post -op Vital signs reviewed and stable  Post vital signs: Reviewed and stable  Complications: No apparent anesthesia complications

## 2014-04-17 NOTE — Op Note (Signed)
Preop Diagnosis: Menorrhagia, Irregular cycles   Postop Diagnosis: Menorrhagia, Irregular cycles   Procedure: DILATATION & CURETTAGE/HYSTEROSCOPY WITH THERMACHOICE ABLATION   Anesthesia: General   Anesthesiologist: Venia Carbon. Royce Macadamia, MD   Attending: Betsy Coder, MD   Assistant: none  Findings: normal uterine cavity.   Pathology:endometrial curretings.   Fluids: per anesthesia  UOP:  EBL: minimal  Complications:  Procedure: The patient was taken to the operating room after risks benefits and alternatives were discussed with patient, the patient verbalized understanding and consent signed and witnessed. The patient was placed under general anesthesia and prepped and draped in normal sterile fashion. A bivalve speculum was placed in the patient's vagina and the anterior lip of the cervix was grasped with a single-tooth tenaculum. The cervix was dilated for passage of the hysteroscope. The uterus sounded to 9.  The hysteroscope was introduced into the uterine cavity with findings as noted above. A curettage was performed and currettings sent to pathology. The hysteroscope was reintroduced and thermachoice ablation was performed without difficulty. A second look revealed that the fundus was fully ablated.  The tenaculum and bivalve speculum were removed and there was good hemostasis at the tenaculum sites. Sponge lap and needle count was correct. The patient tolerated procedure well and was returned to the recovery room in good condition.

## 2014-04-17 NOTE — Anesthesia Postprocedure Evaluation (Signed)
  Anesthesia Post-op Note  Patient: Loretta Norris  Procedure(s) Performed: Procedure(s): DILATATION & CURETTAGE/HYSTEROSCOPY WITH THERMACHOICE ABLATION (N/A)  Patient Location: PACU  Anesthesia Type:General  Level of Consciousness: awake, alert  and oriented  Airway and Oxygen Therapy: Patient Spontanous Breathing  Post-op Pain: mild  Post-op Assessment: Post-op Vital signs reviewed, Patient's Cardiovascular Status Stable, Respiratory Function Stable, Patent Airway, No signs of Nausea or vomiting and Pain level controlled  Post-op Vital Signs: Reviewed and stable  Last Vitals:  Filed Vitals:   04/17/14 1530  BP: 148/89  Pulse: 68  Temp:   Resp: 14    Complications: No apparent anesthesia complications

## 2014-04-18 ENCOUNTER — Encounter (HOSPITAL_COMMUNITY): Payer: Self-pay | Admitting: Obstetrics and Gynecology

## 2014-07-28 ENCOUNTER — Other Ambulatory Visit: Payer: Self-pay

## 2014-07-28 DIAGNOSIS — Z1231 Encounter for screening mammogram for malignant neoplasm of breast: Secondary | ICD-10-CM

## 2014-07-29 ENCOUNTER — Ambulatory Visit
Admission: RE | Admit: 2014-07-29 | Discharge: 2014-07-29 | Disposition: A | Discharge status: Home / Self Care | Payer: BC Managed Care – PPO | Source: Ambulatory Visit

## 2014-07-29 DIAGNOSIS — Z1231 Encounter for screening mammogram for malignant neoplasm of breast: Secondary | ICD-10-CM

## 2014-07-30 ENCOUNTER — Other Ambulatory Visit: Payer: Self-pay | Admitting: Internal Medicine

## 2014-07-30 DIAGNOSIS — R928 Other abnormal and inconclusive findings on diagnostic imaging of breast: Secondary | ICD-10-CM

## 2014-08-11 ENCOUNTER — Ambulatory Visit
Admission: RE | Admit: 2014-08-11 | Discharge: 2014-08-11 | Disposition: A | Discharge status: Home / Self Care | Payer: BC Managed Care – PPO | Source: Ambulatory Visit | Attending: Internal Medicine | Admitting: Internal Medicine

## 2014-08-11 ENCOUNTER — Other Ambulatory Visit: Payer: Self-pay | Admitting: Internal Medicine

## 2014-08-11 DIAGNOSIS — R928 Other abnormal and inconclusive findings on diagnostic imaging of breast: Secondary | ICD-10-CM

## 2015-03-04 ENCOUNTER — Other Ambulatory Visit: Payer: Self-pay | Admitting: Internal Medicine

## 2015-03-04 DIAGNOSIS — N6310 Unspecified lump in the right breast, unspecified quadrant: Secondary | ICD-10-CM

## 2015-03-12 ENCOUNTER — Ambulatory Visit
Admission: RE | Admit: 2015-03-12 | Discharge: 2015-03-12 | Disposition: A | Discharge status: Home / Self Care | Payer: BC Managed Care – PPO | Source: Ambulatory Visit | Attending: Internal Medicine | Admitting: Internal Medicine

## 2015-03-12 DIAGNOSIS — N6310 Unspecified lump in the right breast, unspecified quadrant: Secondary | ICD-10-CM

## 2015-04-14 ENCOUNTER — Other Ambulatory Visit: Payer: Self-pay | Admitting: Family

## 2015-04-14 ENCOUNTER — Ambulatory Visit
Admission: RE | Admit: 2015-04-14 | Discharge: 2015-04-14 | Disposition: A | Discharge status: Home / Self Care | Payer: BC Managed Care – PPO | Source: Ambulatory Visit | Attending: Family | Admitting: Family

## 2015-04-14 DIAGNOSIS — R52 Pain, unspecified: Secondary | ICD-10-CM

## 2015-10-22 ENCOUNTER — Other Ambulatory Visit: Payer: Self-pay

## 2015-10-22 DIAGNOSIS — Z1231 Encounter for screening mammogram for malignant neoplasm of breast: Secondary | ICD-10-CM

## 2015-11-05 ENCOUNTER — Ambulatory Visit
Admission: RE | Admit: 2015-11-05 | Discharge: 2015-11-05 | Disposition: A | Discharge status: Home / Self Care | Payer: BC Managed Care – PPO | Source: Ambulatory Visit

## 2015-11-05 DIAGNOSIS — Z1231 Encounter for screening mammogram for malignant neoplasm of breast: Secondary | ICD-10-CM

## 2015-11-17 IMAGING — CR DG KNEE COMPLETE 4+V*R*
3 series · 3 of 3 positions shown · non-contrast
Comparison: None.

CLINICAL DATA: Lateral knee pain for 4 weeks, no trauma.

EXAM:
RIGHT KNEE - COMPLETE 4+ VIEW

[w knee ap right (1 of 2)]
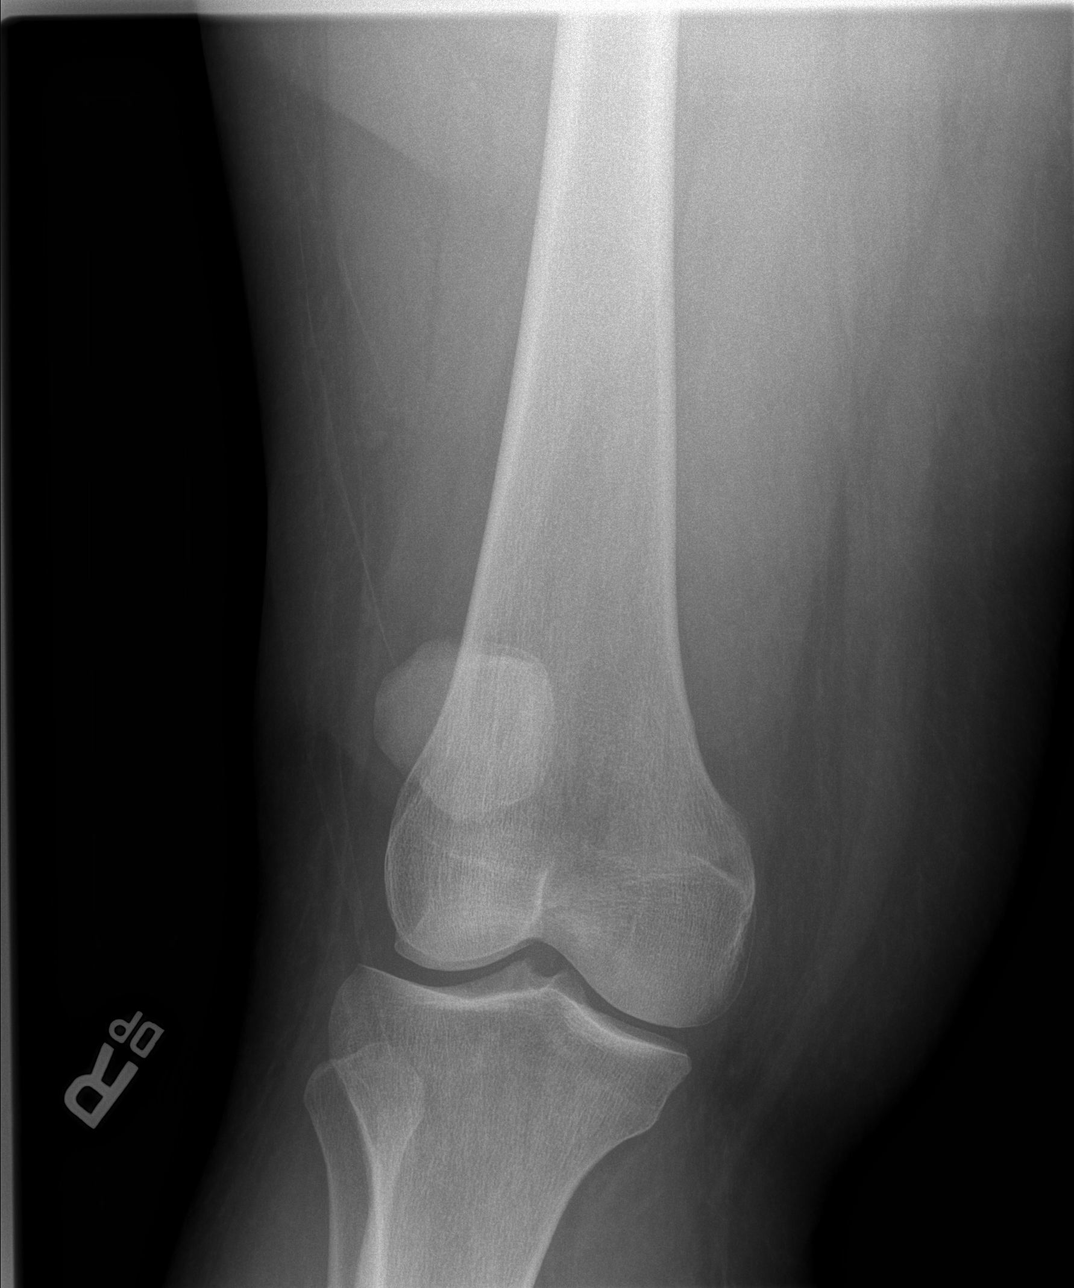

[w knee ap right (2 of 2)]
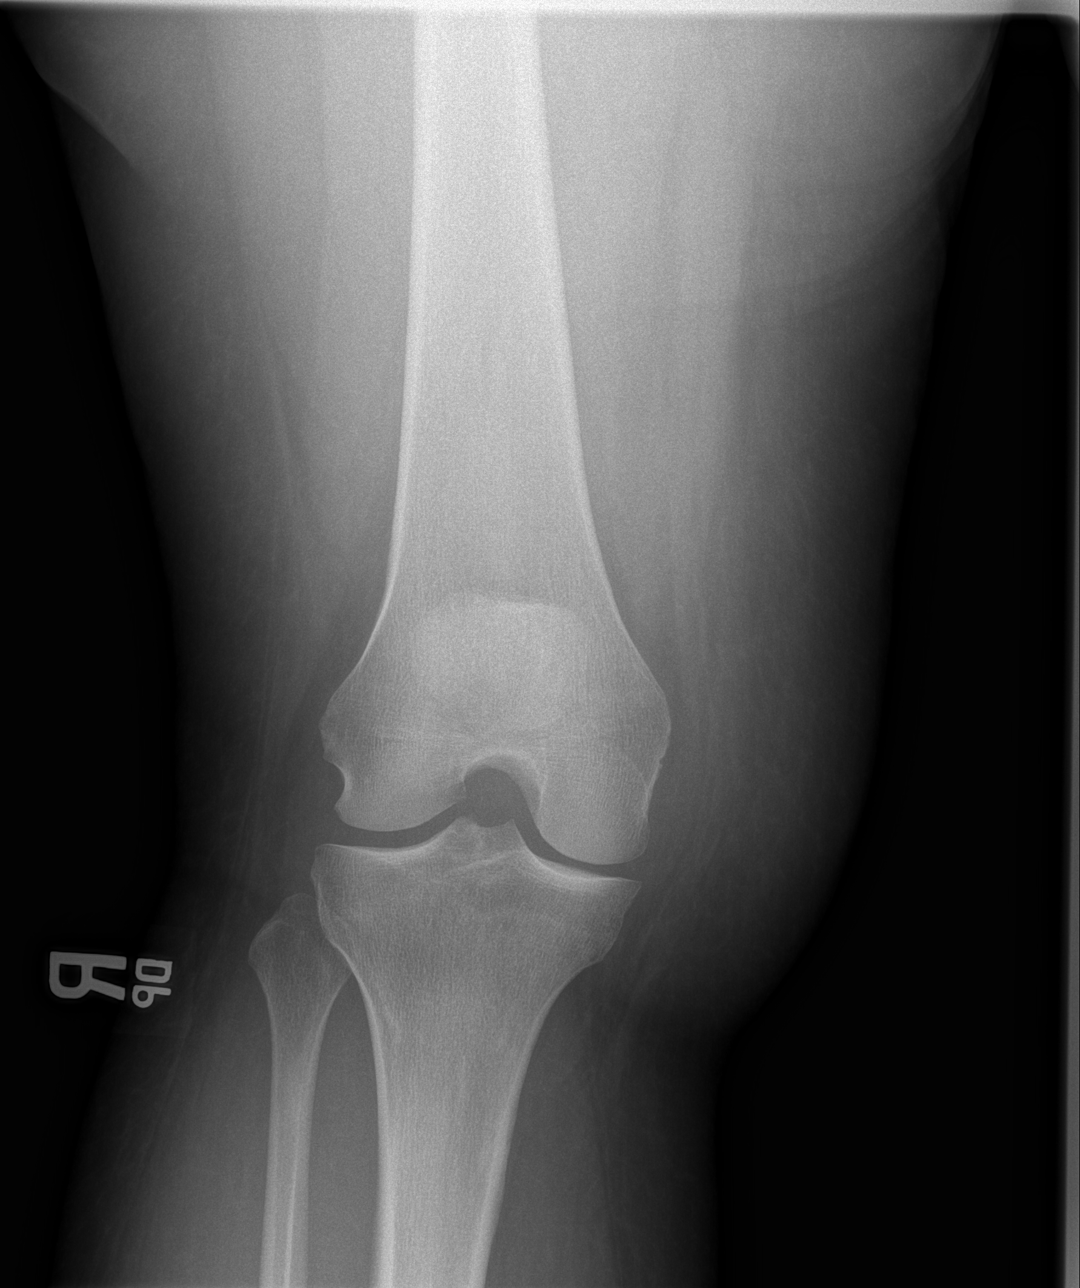

[w knee lat. right]
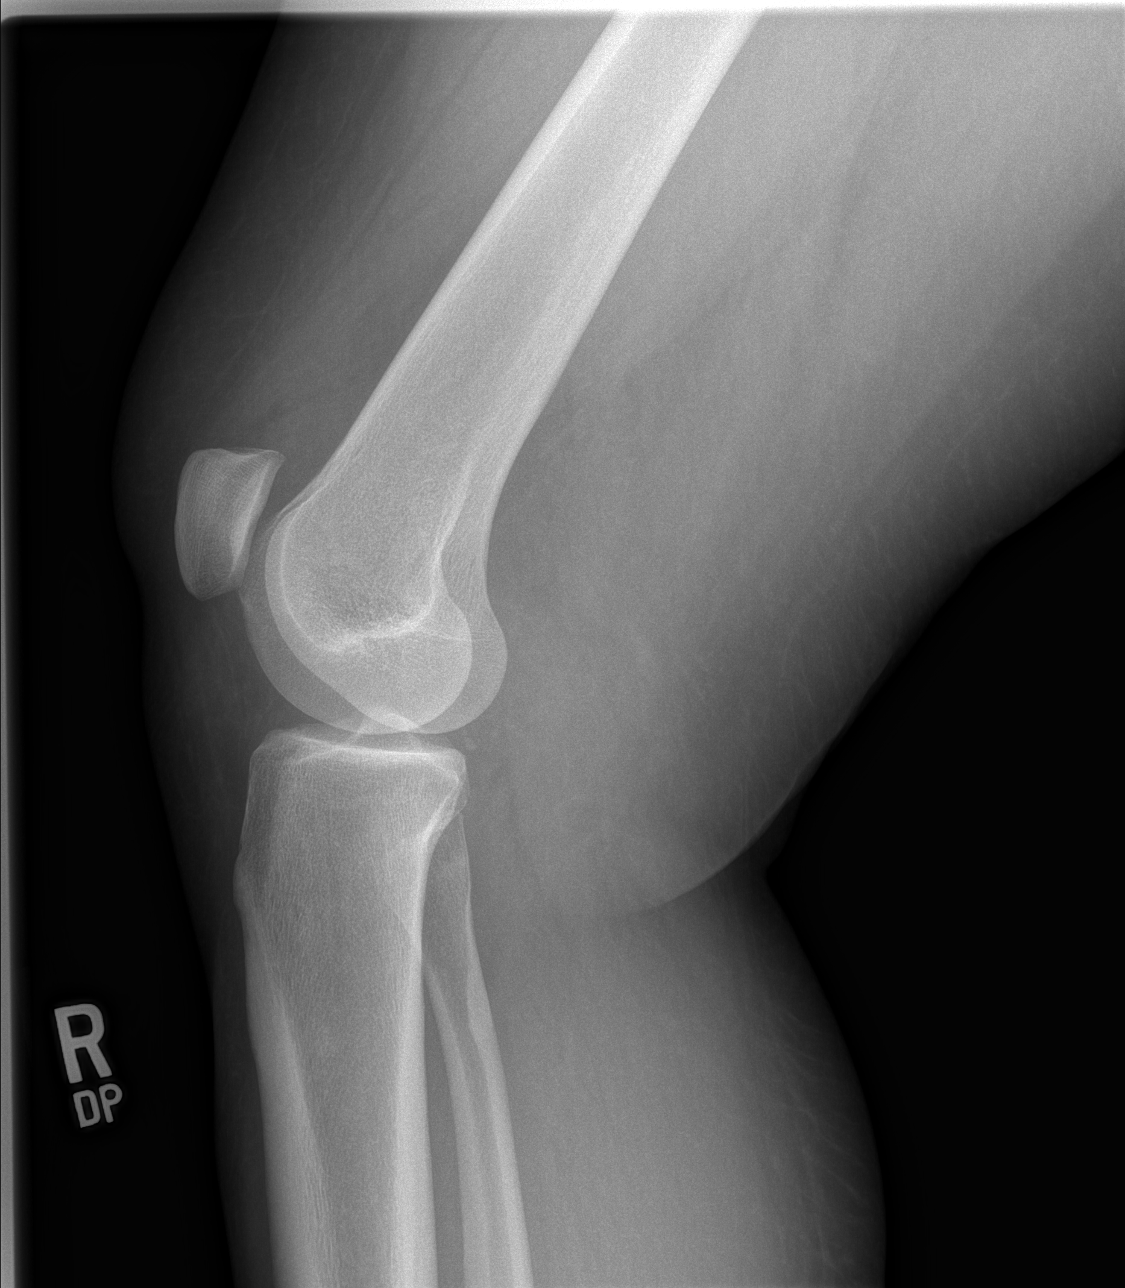

[3 of 3 positions shown; findings below may reference images not displayed]

FINDINGS: No fracture or joint effusion. There may be a small osteophyte along
the lateral aspect of the patella, seen on the sunrise view.
IMPRESSION: No acute findings to explain the patient's pain. There may be a
small osteophyte off along the lateral aspect of the patella.
Otherwise, no degenerative changes.

## 2017-03-22 ENCOUNTER — Emergency Department (HOSPITAL_COMMUNITY)
Admission: EM | Admit: 2017-03-22 | Discharge: 2017-03-22 | Disposition: A | Discharge status: Home / Self Care | Payer: BC Managed Care – PPO | Attending: Emergency Medicine | Admitting: Emergency Medicine

## 2017-03-22 ENCOUNTER — Emergency Department (HOSPITAL_COMMUNITY): Payer: BC Managed Care – PPO

## 2017-03-22 ENCOUNTER — Encounter (HOSPITAL_COMMUNITY): Payer: Self-pay | Admitting: Emergency Medicine

## 2017-03-22 DIAGNOSIS — F17200 Nicotine dependence, unspecified, uncomplicated: Secondary | ICD-10-CM | POA: Insufficient documentation

## 2017-03-22 DIAGNOSIS — S86019A Strain of unspecified Achilles tendon, initial encounter: Secondary | ICD-10-CM | POA: Diagnosis not present

## 2017-03-22 DIAGNOSIS — Y9349 Activity, other involving dancing and other rhythmic movements: Secondary | ICD-10-CM | POA: Insufficient documentation

## 2017-03-22 DIAGNOSIS — Y998 Other external cause status: Secondary | ICD-10-CM | POA: Diagnosis not present

## 2017-03-22 DIAGNOSIS — Y9239 Other specified sports and athletic area as the place of occurrence of the external cause: Secondary | ICD-10-CM | POA: Insufficient documentation

## 2017-03-22 DIAGNOSIS — Z79899 Other long term (current) drug therapy: Secondary | ICD-10-CM | POA: Diagnosis not present

## 2017-03-22 DIAGNOSIS — I10 Essential (primary) hypertension: Secondary | ICD-10-CM | POA: Insufficient documentation

## 2017-03-22 DIAGNOSIS — X501XXA Overexertion from prolonged static or awkward postures, initial encounter: Secondary | ICD-10-CM | POA: Insufficient documentation

## 2017-03-22 DIAGNOSIS — S99921A Unspecified injury of right foot, initial encounter: Secondary | ICD-10-CM | POA: Diagnosis present

## 2017-03-22 MED ORDER — MELOXICAM 15 MG PO TABS: 15.0000 mg | ORAL_TABLET | Freq: Every day | ORAL | 0 refills | Status: DC

## 2017-03-22 NOTE — ED Triage Notes (Signed)
Pt reports right foot injury post fall while exercising . Arrived using single crutch. sts pian 10/10. ROM intact . sts pain radiates to ankle. No edema noted.

## 2017-03-22 NOTE — Discharge Instructions (Signed)
Ice and elevate your foot. Wear cam walker if ambulating. Use crutches as needed. Follow up with orthopedics specialist for further evaluation and treatment.

## 2017-03-22 NOTE — ED Notes (Signed)
Bed: WTR7 Expected date:  Expected time:  Means of arrival:  Comments: 

## 2017-03-22 NOTE — ED Provider Notes (Signed)
[facility] DEPT Provider Note   CSN: [id number] Arrival date & time: 03/22/17  1002  By signing my name below, I, Collene Leyden, attest that this documentation has been prepared under the direction and in the presence of Felesha Moncrieffe, PA-C. Electronically Signed: Collene Leyden, Scribe. 03/22/17. 10:47 AM.  History   Chief Complaint Chief Complaint  Patient presents with  . Foot Injury    right   HPI Comments: Noah Pelaez is a 46 y.o. female with a history of hypertension, who presents to the Emergency Department complaining of sudden-onset, constant right foot pain s/p injury that occurred earlier today. Patient states she was taking a zumba class this morning when she began doing a sided lunge movement, when she suddenly began having right foot pain. Pain in the back of the ankle, sharp. No additional symptoms noted. No medications taken prior to arrival. Patient was ambulating with a single crutch upon arrival and was brought into the emergency department via wheel chair. Pain is worsened with moving ankle and applying pressure. Patient denies any numbness, tingling, weakness, fever, or chills.   The history is provided by the patient. No language interpreter was used.    Past Medical History:  Diagnosis Date  . Hypertension     Patient Active Problem List   Diagnosis Date Noted  . BACTERIAL VAGINITIS 06/01/2010  . VAGINAL DISCHARGE 06/01/2010  . 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 SMOKER 09/22/2009  . URINALYSIS, ABNORMAL 09/22/2009  . LEUKOCYTOSIS 08/24/2009  . ESSENTIAL HYPERTENSION, BENIGN 08/13/2009  . SEBACEOUS CYST 08/13/2009  . CBC, ABNORMAL 05/04/2009  . ALLERGIC RHINITIS 04/06/2009  . LYMPHADENOPATHY, AXILLA 04/06/2009  . ELEVATED BP READING WITHOUT DX HYPERTENSION 04/06/2009    Past Surgical History:  Procedure Laterality Date  . DILATION AND CURETTAGE OF UTERUS    . DILITATION & CURRETTAGE/HYSTROSCOPY WITH THERMACHOICE ABLATION N/A 04/17/2014     Procedure: DILATATION & CURETTAGE/HYSTEROSCOPY WITH THERMACHOICE ABLATION;  Surgeon: Betsy Coder, MD;  Location: [facility];  Service: Gynecology;  Laterality: N/A;  . LEG SURGERY     "removal of cyst"  . TUBAL LIGATION      OB History    No data available       Home Medications    Prior to Admission medications   Medication Sig Start Date End Date Taking? Authorizing Provider  HYDROcodone-acetaminophen (NORCO/VICODIN) 5-325 MG per tablet Take 1 tablet by mouth every 6 (six) hours as needed. 04/17/14   Crawford Givens, MD  lisinopril-hydrochlorothiazide (PRINZIDE,ZESTORETIC) 20-12.5 MG per tablet Take 1 tablet by mouth daily.    [provider]  OVER THE COUNTER MEDICATION Take 8.6 mg by mouth daily. Vegetable Laxative    [provider]  tranexamic acid (LYSTEDA) 650 MG TABS tablet Take 1,300 mg by mouth 2 (two) times daily as needed.    [provider]  triamcinolone cream (KENALOG) 0.1 % Apply 1 application topically 2 (two) times daily as needed. For Eczema    [provider]    Family History No family history on file.  Social History Social History  Substance Use Topics  . Smoking status: Current Every Day Smoker  . Smokeless tobacco: Not on file  . Alcohol use Yes     Comment: occasional     Allergies   Naproxen sodium   Review of Systems Review of Systems  Constitutional: Negative for chills and fever.  Musculoskeletal: Positive for arthralgias (right foot). Negative for gait problem and joint swelling.  Neurological: Negative for weakness and numbness.  Physical Exam Updated Vital Signs BP (!) 156/101   Pulse 76   Temp 98.7 F (37.1 C) (Oral)   Resp 18   LMP 03/17/2017   SpO2 97%   Physical Exam  Constitutional: She is oriented to person, place, and time. She appears well-developed and well-nourished.  HENT:  Head: Normocephalic and atraumatic.  Eyes: Pupils are equal, round, and reactive to light. EOM are  normal.  Neck: Normal range of motion. Neck supple.  Cardiovascular: Normal rate.   Pulmonary/Chest: Effort normal.  Musculoskeletal: Normal range of motion. She exhibits tenderness. She exhibits no edema or deformity.  Achilles tendon is intact. Tenderness over Achilles tendon and posterior tibialis tendons. Patient is able to actively dorsiflex and plantarflex the foot with strength intact. Pain with full dorsiflexion, no pain with plantar flexion. Pain with inversion and eversion of the foot. No bony tenderness. Dorsal pedal pulses intact.  Neurological: She is alert and oriented to person, place, and time.  Skin: Skin is warm and dry.  Psychiatric: She has a normal mood and affect.  Nursing note and vitals reviewed.    ED Treatments / Results  DIAGNOSTIC STUDIES: Oxygen Saturation is 97% on RA, normal by my interpretation.    COORDINATION OF CARE: 10:46 AM Discussed treatment plan with pt at bedside and pt agreed to plan, which includes imaging.   Labs (all labs ordered are listed, but only abnormal results are displayed) Labs Reviewed - No data to display  EKG  EKG Interpretation None       Radiology Dg Ankle Complete Right  Result Date: 03/22/2017 CLINICAL DATA:  Twisted ankle an exercise class.  Injury today. EXAM: RIGHT ANKLE - COMPLETE 3+ VIEW COMPARISON:  None. FINDINGS: There is no evidence of fracture, dislocation, or joint effusion. There is no evidence of arthropathy or other focal bone abnormality. Soft tissues are unremarkable. IMPRESSION: Negative. Electronically Signed   By: Nelson Chimes M.D.   On: 03/22/2017 10:51    Procedures Procedures (including critical care time)  Medications Ordered in ED Medications - No data to display   Initial Impression / Assessment and Plan / ED Course  I have reviewed the triage vital signs and the nursing notes.  Pertinent labs & imaging results that were available during my care of the patient were reviewed by me and  considered in my medical decision making (see chart for details).     Patient in emergency department with pain to the right Achilles tendon and posterior tibial tendons after taking suitable class and injuring her foot this morning. Patient is able to bear weight but is limping and using crutches. On exam, Achilles tendon is intact. Joint is stable. X-rays negative. Discussed plan of immobilization, ice, rest, NSAIDs. Follow-up with orthopedics. Patient agreed to the plan.  Vitals:   03/22/17 1021  BP: (!) 156/101  Pulse: 76  Resp: 18  Temp: 98.7 F (37.1 C)  TempSrc: Oral  SpO2: 97%     Final Clinical Impressions(s) / ED Diagnoses   Final diagnoses:  Strain of Achilles tendon, initial encounter    New Prescriptions New Prescriptions   MELOXICAM (MOBIC) 15 MG TABLET    Take 1 tablet (15 mg total) by mouth daily.   I personally performed the services described in this documentation, which was scribed in my presence. The recorded information has been reviewed and is accurate.     Jeannett Senior, PA-C 03/22/17 [place], Ankit, MD 03/23/17 [phone]

## 2017-04-06 ENCOUNTER — Ambulatory Visit: Payer: BC Managed Care – PPO | Admitting: Podiatry

## 2018-11-07 LAB — HM MAMMOGRAPHY

## 2018-11-23 ENCOUNTER — Encounter: Payer: Self-pay | Admitting: Internal Medicine

## 2018-12-03 ENCOUNTER — Encounter: Payer: Self-pay | Admitting: Internal Medicine

## 2018-12-03 ENCOUNTER — Other Ambulatory Visit: Payer: Self-pay

## 2018-12-03 ENCOUNTER — Ambulatory Visit: Payer: BC Managed Care – PPO | Admitting: Internal Medicine

## 2018-12-03 VITALS — BP 146/92 | HR 90 | Temp 98.6°F | Ht 60.6 in | Wt 192.8 lb

## 2018-12-03 DIAGNOSIS — Z6836 Body mass index (BMI) 36.0-36.9, adult: Secondary | ICD-10-CM

## 2018-12-03 DIAGNOSIS — I10 Essential (primary) hypertension: Secondary | ICD-10-CM | POA: Diagnosis not present

## 2018-12-03 DIAGNOSIS — Z0000 Encounter for general adult medical examination without abnormal findings: Secondary | ICD-10-CM

## 2018-12-03 DIAGNOSIS — R05 Cough: Secondary | ICD-10-CM

## 2018-12-03 DIAGNOSIS — E6609 Other obesity due to excess calories: Secondary | ICD-10-CM

## 2018-12-03 DIAGNOSIS — F17210 Nicotine dependence, cigarettes, uncomplicated: Secondary | ICD-10-CM

## 2018-12-03 DIAGNOSIS — R059 Cough, unspecified: Secondary | ICD-10-CM

## 2018-12-03 LAB — POCT URINALYSIS DIPSTICK
Bilirubin, UA: NEGATIVE
Blood, UA: NEGATIVE
Glucose, UA: NEGATIVE
Ketones, UA: NEGATIVE
Leukocytes, UA: NEGATIVE
Nitrite, UA: NEGATIVE
Protein, UA: NEGATIVE
Spec Grav, UA: <=1.005 — AB (ref 1.010–1.025)
Urobilinogen, UA: 0.2 E.U./dL
pH, UA: 5.5 (ref 5.0–8.0)

## 2018-12-03 LAB — POCT UA - MICROALBUMIN
Albumin/Creatinine Ratio, Urine, POC: <30
Creatinine, POC: 10 mg/dL
Microalbumin Ur, POC: 10 mg/L

## 2018-12-03 MED ORDER — BUPROPION HCL ER (XL) 150 MG PO TB24: 150.0000 mg | ORAL_TABLET | ORAL | 2 refills | Status: DC

## 2018-12-03 NOTE — Patient Instructions (Addendum)
Health Maintenance, Female Adopting a healthy lifestyle and getting preventive care can go a long way to promote health and wellness. Talk with your health care provider about what schedule of regular examinations is right for you. This is a good chance for you to check in with your provider about disease prevention and staying healthy. In between checkups, there are plenty of things you can do on your own. Experts have done a lot of research about which lifestyle changes and preventive measures are most likely to keep you healthy. Ask your health care provider for more information. Weight and diet Eat a healthy diet  Be sure to include plenty of vegetables, fruits, low-fat dairy products, and lean protein.  Do not eat a lot of foods high in solid fats, added sugars, or salt.  Get regular exercise. This is one of the most important things you can do for your health. ? Most adults should exercise for at least 150 minutes each week. The exercise should increase your heart rate and make you sweat (moderate-intensity exercise). ? Most adults should also do strengthening exercises at least twice a week. This is in addition to the moderate-intensity exercise. Maintain a healthy weight  Body mass index (BMI) is a measurement that can be used to identify possible weight problems. It estimates body fat based on height and weight. Your health care provider can help determine your BMI and help you achieve or maintain a healthy weight.  For females 20 years of age and older: ? A BMI below 18.5 is considered underweight. ? A BMI of 18.5 to 24.9 is normal. ? A BMI of 25 to 29.9 is considered overweight. ? A BMI of 30 and above is considered obese. Watch levels of cholesterol and blood lipids  You should start having your blood tested for lipids and cholesterol at 49 years of age, then have this test every 5 years.  You may need to have your cholesterol levels checked more often if: ? Your lipid or  cholesterol levels are high. ? You are older than 50 years of age. ? You are at high risk for heart disease. Cancer screening Lung Cancer  Lung cancer screening is recommended for adults 55-80 years old who are at high risk for lung cancer because of a history of smoking.  A yearly low-dose CT scan of the lungs is recommended for people who: ? Currently smoke. ? Have quit within the past 15 years. ? Have at least a 30-pack-year history of smoking. A pack year is smoking an average of one pack of cigarettes a day for 1 year.  Yearly screening should continue until it has been 15 years since you quit.  Yearly screening should stop if you develop a health problem that would prevent you from having lung cancer treatment. Breast Cancer  Practice breast self-awareness. This means understanding how your breasts normally appear and feel.  It also means doing regular breast self-exams. Let your health care provider know about any changes, no matter how small.  If you are in your 20s or 30s, you should have a clinical breast exam (CBE) by a health care provider every 1-3 years as part of a regular health exam.  If you are 40 or older, have a CBE every year. Also consider having a breast X-ray (mammogram) every year.  If you have a family history of breast cancer, talk to your health care provider about genetic screening.  If you are at high risk for breast cancer, talk   to your health care provider about having an MRI and a mammogram every year.  Breast cancer gene (BRCA) assessment is recommended for women who have family members with BRCA-related cancers. BRCA-related cancers include: ? Breast. ? Ovarian. ? Tubal. ? Peritoneal cancers.  Results of the assessment will determine the need for genetic counseling and BRCA1 and BRCA2 testing. Cervical Cancer Your health care provider may recommend that you be screened regularly for cancer of the pelvic organs (ovaries, uterus, and vagina).  This screening involves a pelvic examination, including checking for microscopic changes to the surface of your cervix (Pap test). You may be encouraged to have this screening done every 3 years, beginning at age 21.  For women ages 30-65, health care providers may recommend pelvic exams and Pap testing every 3 years, or they may recommend the Pap and pelvic exam, combined with testing for human papilloma virus (HPV), every 5 years. Some types of HPV increase your risk of cervical cancer. Testing for HPV may also be done on women of any age with unclear Pap test results.  Other health care providers may not recommend any screening for nonpregnant women who are considered low risk for pelvic cancer and who do not have symptoms. Ask your health care provider if a screening pelvic exam is right for you.  If you have had past treatment for cervical cancer or a condition that could lead to cancer, you need Pap tests and screening for cancer for at least 20 years after your treatment. If Pap tests have been discontinued, your risk factors (such as having a new sexual partner) need to be reassessed to determine if screening should resume. Some women have medical problems that increase the chance of getting cervical cancer. In these cases, your health care provider may recommend more frequent screening and Pap tests. Colorectal Cancer  This type of cancer can be detected and often prevented.  Routine colorectal cancer screening usually begins at 50 years of age and continues through 49 years of age.  Your health care provider may recommend screening at an earlier age if you have risk factors for colon cancer.  Your health care provider may also recommend using home test kits to check for hidden blood in the stool.  A small camera at the end of a tube can be used to examine your colon directly (sigmoidoscopy or colonoscopy). This is done to check for the earliest forms of colorectal cancer.  Routine  screening usually begins at age 50.  Direct examination of the colon should be repeated every 5-10 years through 49 years of age. However, you may need to be screened more often if early forms of precancerous polyps or small growths are found. Skin Cancer  Check your skin from head to toe regularly.  Tell your health care provider about any new moles or changes in moles, especially if there is a change in a mole's shape or color.  Also tell your health care provider if you have a mole that is larger than the size of a pencil eraser.  Always use sunscreen. Apply sunscreen liberally and repeatedly throughout the day.  Protect yourself by wearing long sleeves, pants, a wide-brimmed hat, and sunglasses whenever you are outside. Heart disease, diabetes, and high blood pressure  High blood pressure causes heart disease and increases the risk of stroke. High blood pressure is more likely to develop in: ? People who have blood pressure in the high end of the normal range (130-139/85-89 mm Hg). ? People   who are overweight or obese. ? People who are African American.  If you are 84-22 years of age, have your blood pressure checked every 3-5 years. If you are 67 years of age or older, have your blood pressure checked every year. You should have your blood pressure measured twice-once when you are at a hospital or clinic, and once when you are not at a hospital or clinic. Record the average of the two measurements. To check your blood pressure when you are not at a hospital or clinic, you can use: ? An automated blood pressure machine at a pharmacy. ? A home blood pressure monitor.  If you are between 52 years and 3 years old, ask your health care provider if you should take aspirin to prevent strokes.  Have regular diabetes screenings. This involves taking a blood sample to check your fasting blood sugar level. ? If you are at a normal weight and have a low risk for diabetes, have this test once  every three years after 49 years of age. ? If you are overweight and have a high risk for diabetes, consider being tested at a younger age or more often. Preventing infection Hepatitis B  If you have a higher risk for hepatitis B, you should be screened for this virus. You are considered at high risk for hepatitis B if: ? You were born in a country where hepatitis B is common. Ask your health care provider which countries are considered high risk. ? Your parents were born in a high-risk country, and you have not been immunized against hepatitis B (hepatitis B vaccine). ? You have HIV or AIDS. ? You use needles to inject street drugs. ? You live with someone who has hepatitis B. ? You have had sex with someone who has hepatitis B. ? You get hemodialysis treatment. ? You take certain medicines for conditions, including cancer, organ transplantation, and autoimmune conditions. Hepatitis C  Blood testing is recommended for: ? Everyone born from [birth year] through 1966. ? Anyone with known risk factors for hepatitis C. Sexually transmitted infections (STIs)  You should be screened for sexually transmitted infections (STIs) including gonorrhea and chlamydia if: ? You are sexually active and are younger than 49 years of age. ? You are older than 49 years of age and your health care provider tells you that you are at risk for this type of infection. ? Your sexual activity has changed since you were last screened and you are at an increased risk for chlamydia or gonorrhea. Ask your health care provider if you are at risk.  If you do not have HIV, but are at risk, it may be recommended that you take a prescription medicine daily to prevent HIV infection. This is called pre-exposure prophylaxis (PrEP). You are considered at risk if: ? You are sexually active and do not regularly use condoms or know the HIV status of your partner(s). ? You take drugs by injection. ? You are sexually active with a partner  who has HIV. Talk with your health care provider about whether you are at high risk of being infected with HIV. If you choose to begin PrEP, you should first be tested for HIV. You should then be tested every 3 months for as long as you are taking PrEP. Pregnancy  If you are premenopausal and you may become pregnant, ask your health care provider about preconception counseling.  If you may become pregnant, take 400 to 800 micrograms (mcg) of folic acid every  day.  If you want to prevent pregnancy, talk to your health care provider about birth control (contraception). Osteoporosis and menopause  Osteoporosis is a disease in which the bones lose minerals and strength with aging. This can result in serious bone fractures. Your risk for osteoporosis can be identified using a bone density scan.  If you are 25 years of age or older, or if you are at risk for osteoporosis and fractures, ask your health care provider if you should be screened.  Ask your health care provider whether you should take a calcium or vitamin D supplement to lower your risk for osteoporosis.  Menopause may have certain physical symptoms and risks.  Hormone replacement therapy may reduce some of these symptoms and risks. Talk to your health care provider about whether hormone replacement therapy is right for you. Follow these instructions at home:  Schedule regular health, dental, and eye exams.  Stay current with your immunizations.  Do not use any tobacco products including cigarettes, chewing tobacco, or electronic cigarettes.  If you are pregnant, do not drink alcohol.  If you are breastfeeding, limit how much and how often you drink alcohol.  Limit alcohol intake to no more than 1 drink per day for nonpregnant women. One drink equals 12 ounces of beer, 5 ounces of wine, or 1 ounces of hard liquor.  Do not use street drugs.  Do not share needles.  Ask your health care provider for help if you need support  or information about quitting drugs.  Tell your health care provider if you often feel depressed.  Tell your health care provider if you have ever been abused or do not feel safe at home. This information is not intended to replace advice given to you by your health care provider. Make sure you discuss any questions you have with your health care provider. Document Released: 03/07/2011 Document Revised: 01/28/2016 Document Reviewed: 05/26/2015 Elsevier Interactive Patient Education  2019 [place].   Bupropion sustained-release tablets (smoking cessation) What is this medicine? BUPROPION (byoo PROE pee on) is used to help people quit smoking. This medicine may be used for other purposes; ask your health care provider or pharmacist if you have questions. COMMON BRAND NAME(S): Buproban, Zyban What should I tell my health care provider before I take this medicine? They need to know if you have any of these conditions: -an eating disorder, such as anorexia or bulimia -bipolar disorder or psychosis -diabetes or high blood sugar, treated with medication -glaucoma -head injury or brain tumor -heart disease, previous heart attack, or irregular heart beat -high blood pressure -kidney or liver disease -seizures -suicidal thoughts or a previous suicide attempt -Tourette's syndrome -weight loss -an unusual or allergic reaction to bupropion, other medicines, foods, dyes, or preservatives -breast-feeding -pregnant or trying to become pregnant How should I use this medicine? Take this medicine by mouth with a glass of water. Follow the directions on the prescription label. You can take it with or without food. If it upsets your stomach, take it with food. Do not cut, crush or chew this medicine. Take your medicine at regular intervals. If you take this medicine more than once a day, take your second dose at least 8 hours after you take your first dose. To limit difficulty in sleeping, avoid  taking this medicine at bedtime. Do not take your medicine more often than directed. Do not stop taking this medicine suddenly except upon the advice of your doctor. Stopping this medicine too quickly may  cause serious side effects. A special MedGuide will be given to you by the pharmacist with each prescription and refill. Be sure to read this information carefully each time. Talk to your pediatrician regarding the use of this medicine in children. Special care may be needed. Overdosage: If you think you have taken too much of this medicine contact a poison control center or emergency room at once. NOTE: This medicine is only for you. Do not share this medicine with others. What if I miss a dose? If you miss a dose, skip the missed dose and take your next tablet at the regular time. There should be at least 8 hours between doses. Do not take double or extra doses. What may interact with this medicine? Do not take this medicine with any of the following medications: -linezolid -MAOIs like Azilect, Carbex, Eldepryl, Marplan, Nardil, and Parnate -methylene blue (injected into a vein) -other medicines that contain bupropion like Wellbutrin This medicine may also interact with the following medications: -alcohol -certain medicines for anxiety or sleep -certain medicines for blood pressure like metoprolol, propranolol -certain medicines for depression or psychotic disturbances -certain medicines for HIV or AIDS like efavirenz, lopinavir, nelfinavir, ritonavir -certain medicines for irregular heart beat like propafenone, flecainide -certain medicines for Parkinson's disease like amantadine, levodopa -certain medicines for seizures like carbamazepine, phenytoin, phenobarbital -cimetidine -clopidogrel -cyclophosphamide -digoxin -furazolidone -isoniazid -nicotine -orphenadrine -procarbazine -steroid medicines like prednisone or cortisone -stimulant medicines for attention disorders, weight  loss, or to stay awake -tamoxifen -theophylline -thiotepa -ticlopidine -tramadol -warfarin This list may not describe all possible interactions. Give your health care provider a list of all the medicines, herbs, non-prescription drugs, or dietary supplements you use. Also tell them if you smoke, drink alcohol, or use illegal drugs. Some items may interact with your medicine. What should I watch for while using this medicine? Visit your doctor or health care professional for regular checks on your progress. This medicine should be used together with a patient support program. It is important to participate in a behavioral program, counseling, or other support program that is recommended by your health care professional. Patients and their families should watch out for new or worsening thoughts of suicide or depression. Also watch out for sudden changes in feelings such as feeling anxious, agitated, panicky, irritable, hostile, aggressive, impulsive, severely restless, overly excited and hyperactive, or not being able to sleep. If this happens, especially at the beginning of treatment or after a change in dose, call your health care professional. Avoid alcoholic drinks while taking this medicine. Drinking excessive alcoholic beverages, using sleeping or anxiety medicines, or quickly stopping the use of these agents while taking this medicine may increase your risk for a seizure. Do not drive or use heavy machinery until you know how this medicine affects you. This medicine can impair your ability to perform these tasks. Do not take this medicine close to bedtime. It may prevent you from sleeping. Your mouth may get dry. Chewing sugarless gum or sucking hard candy, and drinking plenty of water may help. Contact your doctor if the problem does not go away or is severe. Do not use nicotine patches or chewing gum without the advice of your doctor or health care professional while taking this medicine. You  may need to have your blood pressure taken regularly if your doctor recommends that you use both nicotine and this medicine together. What side effects may I notice from receiving this medicine? Side effects that you should report to your doctor  or health care professional as soon as possible: -allergic reactions like skin rash, itching or hives, swelling of the face, lips, or tongue -breathing problems -changes in vision -confusion -elevated mood, decreased need for sleep, racing thoughts, impulsive behavior -fast or irregular heartbeat -hallucinations, loss of contact with reality -increased blood pressure -redness, blistering, peeling or loosening of the skin, including inside the mouth -seizures -suicidal thoughts or other mood changes -unusually weak or tired -vomiting Side effects that usually do not require medical attention (report to your doctor or health care professional if they continue or are bothersome): -constipation -headache -loss of appetite -nausea -tremors -weight loss This list may not describe all possible side effects. Call your doctor for medical advice about side effects. You may report side effects to FDA at 1-800-FDA-1088. Where should I keep my medicine? Keep out of the reach of children. Store at room temperature between 20 and 25 degrees C (68 and 77 degrees F). Protect from light. Keep container tightly closed. Throw away any unused medicine after the expiration date. NOTE: This sheet is a summary. It may not cover all possible information. If you have questions about this medicine, talk to your doctor, pharmacist, or health care provider.  2019 Elsevier/[employer] Standard (2016-02-12 13:49:28)

## 2018-12-03 NOTE — Progress Notes (Signed)
Subjective:     Patient ID: Loretta Norris , female    DOB: 02/20/1971 , 48 y.o.   MRN: [id number]   Chief Complaint  Patient presents with  . Annual Exam  . Hypertension    HPI  She is here today for a full physical examination. She is followed by Dr. Charlesetta Garibaldi for her GYN examination.   Hypertension  This is a chronic problem. The current episode started more than 1 year ago. The problem has been gradually improving since onset. The problem is controlled. Pertinent negatives include no blurred vision, chest pain, palpitations or shortness of breath.   She reports compliance with meds.   Past Medical History:  Diagnosis Date  . Hypertension      Family History  Problem Relation Age of Onset  . Asthma Mother   . Hypertension Mother      Current Outpatient Medications:  .  cetirizine (ZYRTEC) 10 MG tablet, Take 10 mg by mouth daily., Disp: , Rfl:  .  Cyanocobalamin (VITAMIN B-12 PO), Take 1 tablet by mouth daily., Disp: , Rfl:  .  Fluocinonide 0.1 % CREA, Apply 2-3 g topically 3 (three) times daily., Disp: , Rfl: 12 .  Ginger, Zingiber officinalis, (GINGER PO), Take 1 capsule by mouth daily., Disp: , Rfl:  .  HYDROcodone-acetaminophen (NORCO/VICODIN) 5-325 MG per tablet, Take 1 tablet by mouth every 6 (six) hours as needed., Disp: 30 tablet, Rfl: 0 .  ibuprofen (ADVIL,MOTRIN) 200 MG tablet, Take 600 mg by mouth every 8 (eight) hours as needed for cramping (menstrual cramping)., Disp: , Rfl:  .  OVER THE COUNTER MEDICATION, Take 8.6 mg by mouth daily. Vegetable Laxative, Disp: , Rfl:  .  telmisartan-hydrochlorothiazide (MICARDIS HCT) 80-12.5 MG tablet, telmisartan 80 mg-hydrochlorothiazide 12.5 mg tablet, Disp: , Rfl:  .  triamcinolone cream (KENALOG) 0.1 %, Apply 1 application topically 2 (two) times daily as needed. For Eczema, Disp: , Rfl:  .  valACYclovir (VALTREX) 500 MG tablet, Take 500 mg by mouth daily as needed (prevent flare ups). , Disp: , Rfl:    Allergies  Allergen  Reactions  . Naproxen Sodium Hives and Itching    Can tolerate Ibuprofen OKI     Review of Systems  Constitutional: Negative.   HENT: Negative.   Eyes: Negative.  Negative for blurred vision.  Respiratory: Positive for cough (she c/o dry cough. no fever/chills/body aches. exacerbated by going outside. ). Negative for shortness of breath.   Cardiovascular: Negative.  Negative for chest pain and palpitations.  Gastrointestinal: Negative.   Endocrine: Negative.   Genitourinary: Negative.   Musculoskeletal: Negative.   Skin: Negative.   Allergic/Immunologic: Negative.   Neurological: Negative.   Hematological: Negative.   Psychiatric/Behavioral: Negative.      Today's Vitals   12/03/18 1144  BP: (!) 146/92  Pulse: 90  Temp: 98.6 F (37 C)  TempSrc: Oral  Weight: 192 lb 12.8 oz (87.5 kg)  Height: 5' 0.6" (1.539 m)   Body mass index is 36.91 kg/m.   Objective:  Physical Exam Vitals signs and nursing note reviewed.  Constitutional:      Appearance: Normal appearance. She is obese.  HENT:     Head: Normocephalic and atraumatic.     Right Ear: Tympanic membrane, ear canal and external ear normal.     Left Ear: Tympanic membrane, ear canal and external ear normal.     Nose: Nose normal.     Mouth/Throat:     Mouth: Mucous membranes are moist.  Pharynx: Oropharynx is clear.  Eyes:     Extraocular Movements: Extraocular movements intact.     Conjunctiva/sclera: Conjunctivae normal.     Pupils: Pupils are equal, round, and reactive to light.  Neck:     Musculoskeletal: Normal range of motion and neck supple.  Cardiovascular:     Rate and Rhythm: Normal rate and regular rhythm.     Pulses: Normal pulses.     Heart sounds: Normal heart sounds.  Pulmonary:     Effort: Pulmonary effort is normal.     Breath sounds: Normal breath sounds.  Abdominal:     General: Abdomen is flat. Bowel sounds are normal.     Palpations: Abdomen is soft.     Tenderness: There is no  abdominal tenderness. There is no guarding.  Genitourinary:    Comments: deferred Musculoskeletal: Normal range of motion.  Skin:    General: Skin is warm and dry.     Capillary Refill: Capillary refill takes less than 2 seconds.  Neurological:     General: No focal deficit present.     Mental Status: She is alert and oriented to person, place, and time.  Psychiatric:        Mood and Affect: Mood normal.        Behavior: Behavior normal.         Assessment And Plan:     1. Routine general medical examination at health care facility  A full exam was performed.  Importance of monthly self breast exams was discussed with the patient. PATIENT HAS BEEN ADVISED TO GET 30-45 MINUTES REGULAR EXERCISE NO LESS THAN FOUR TO FIVE DAYS PER WEEK - BOTH WEIGHTBEARING EXERCISES AND AEROBIC ARE RECOMMENDED.  SHE WAS ADVISED TO FOLLOW A HEALTHY DIET WITH AT LEAST SIX FRUITS/VEGGIES PER DAY, DECREASE INTAKE OF RED MEAT, AND TO INCREASE FISH INTAKE TO TWO DAYS PER WEEK.  MEATS/FISH SHOULD NOT BE FRIED, BAKED OR BROILED IS PREFERABLE.  I SUGGEST WEARING SPF 50 SUNSCREEN ON EXPOSED PARTS AND ESPECIALLY WHEN IN THE DIRECT SUNLIGHT FOR AN EXTENDED PERIOD OF TIME.  PLEASE AVOID FAST FOOD RESTAURANTS AND INCREASE YOUR WATER INTAKE.  - CMP14+EGFR - CBC - Lipid panel - Hemoglobin A1c  2. Essential hypertension, benign  Fair control. She will continue with current meds. EKG performed, NSR w/ frequent PACs. She will rto in six months for re-evaluation.   - EKG 12-Lead - POCT Urinalysis Dipstick (81002) - POCT UA - Microalbumin  3. Class 2 obesity due to excess calories with body mass index (BMI) of 36.0 to 36.9 in adult, unspecified whether serious comorbidity present  Importance of achieving optimal weight to decrease risk of cardiovascular disease and cancers was discussed with the patient in full detail. She is encouraged to start slowly - start with 10 minutes twice daily at least three to four days per  week and to gradually build to 30 minutes five days weekly. She was given tips to incorporate more activity into her daily routine - take stairs when possible, park farther away from her job, grocery stores, etc.      4. Cigarette nicotine dependence without complication  We discussed the importance of smoking cessation for greater than 5 minutes. She agrees that it is time for her to quit. We discussed the use of Bupropion XL to help with withdrawal sx/cravings associated with smoking cessation. I will send in rx 124m once daily. Possible side effects were discussed with the patient  5. Cough  Her symptoms are  likely due to postnasal drip. She was given samples of Allegra 19m daily. She will let me know if her sx persist.    RMaximino Greenland MD

## 2018-12-04 LAB — LIPID PANEL
Chol/HDL Ratio: 2.2 ratio (ref 0.0–4.4)
Cholesterol, Total: 183 mg/dL (ref 100–199)
HDL: 84 mg/dL (ref >39)
LDL Calculated: 77 mg/dL (ref 0–99)
Triglycerides: 110 mg/dL (ref 0–149)
VLDL Cholesterol Cal: 22 mg/dL (ref 5–40)

## 2018-12-04 LAB — CMP14+EGFR
ALT: 9 IU/L (ref 0–32)
AST: 12 IU/L (ref 0–40)
Albumin/Globulin Ratio: 1.5 (ref 1.2–2.2)
Albumin: 4.3 g/dL (ref 3.8–4.8)
Alkaline Phosphatase: 69 IU/L (ref 39–117)
BUN/Creatinine Ratio: 22 (ref 9–23)
BUN: 19 mg/dL (ref 6–24)
Bilirubin Total: 0.6 mg/dL (ref 0.0–1.2)
CO2: 21 mmol/L (ref 20–29)
Calcium: 9.6 mg/dL (ref 8.7–10.2)
Chloride: 98 mmol/L (ref 96–106)
Creatinine, Ser: 0.88 mg/dL (ref 0.57–1.00)
GFR calc Af Amer: 89 mL/min/{1.73_m2} (ref >59)
GFR calc non Af Amer: 77 mL/min/{1.73_m2} (ref >59)
Globulin, Total: 2.8 g/dL (ref 1.5–4.5)
Glucose: 84 mg/dL (ref 65–99)
Potassium: 4 mmol/L (ref 3.5–5.2)
Sodium: 137 mmol/L (ref 134–144)
Total Protein: 7.1 g/dL (ref 6.0–8.5)

## 2018-12-04 LAB — CBC
Hematocrit: 44 % (ref 34.0–46.6)
Hemoglobin: 14.4 g/dL (ref 11.1–15.9)
MCH: 31.1 pg (ref 26.6–33.0)
MCHC: 32.7 g/dL (ref 31.5–35.7)
MCV: 95 fL (ref 79–97)
Platelets: 246 10*3/uL (ref 150–450)
RBC: 4.63 x10E6/uL (ref 3.77–5.28)
RDW: 12.9 % (ref 11.7–15.4)
WBC: 14.1 10*3/uL — ABNORMAL HIGH (ref 3.4–10.8)

## 2018-12-04 LAB — HEMOGLOBIN A1C
Est. average glucose Bld gHb Est-mCnc: 103 mg/dL
Hgb A1c MFr Bld: 5.2 % (ref 4.8–5.6)

## 2018-12-09 ENCOUNTER — Encounter: Payer: Self-pay | Admitting: Internal Medicine

## 2018-12-10 ENCOUNTER — Other Ambulatory Visit: Payer: Self-pay

## 2018-12-10 ENCOUNTER — Encounter: Payer: BC Managed Care – PPO | Admitting: Internal Medicine

## 2018-12-10 MED ORDER — FEXOFENADINE HCL 180 MG PO TABS: 180.0000 mg | ORAL_TABLET | Freq: Every day | ORAL | 2 refills | Status: DC

## 2018-12-31 ENCOUNTER — Encounter: Payer: Self-pay | Admitting: Internal Medicine

## 2018-12-31 ENCOUNTER — Ambulatory Visit: Payer: BC Managed Care – PPO | Admitting: Internal Medicine

## 2018-12-31 ENCOUNTER — Other Ambulatory Visit: Payer: Self-pay

## 2018-12-31 VITALS — BP 126/84 | HR 53 | Temp 97.7°F | Ht 60.6 in | Wt 200.2 lb

## 2018-12-31 DIAGNOSIS — F4321 Adjustment disorder with depressed mood: Secondary | ICD-10-CM

## 2018-12-31 DIAGNOSIS — F17210 Nicotine dependence, cigarettes, uncomplicated: Secondary | ICD-10-CM | POA: Diagnosis not present

## 2018-12-31 DIAGNOSIS — R635 Abnormal weight gain: Secondary | ICD-10-CM | POA: Diagnosis not present

## 2018-12-31 DIAGNOSIS — E6609 Other obesity due to excess calories: Secondary | ICD-10-CM

## 2018-12-31 DIAGNOSIS — Z6838 Body mass index (BMI) 38.0-38.9, adult: Secondary | ICD-10-CM

## 2018-12-31 MED ORDER — BUPROPION HCL ER (XL) 300 MG PO TB24: 300.0000 mg | ORAL_TABLET | ORAL | 2 refills | Status: DC

## 2018-12-31 NOTE — Patient Instructions (Signed)
Preventing Unhealthy Weight Gain, Adult  Staying at a healthy weight is important to your overall health. When fat builds up in your body, you may become overweight or obese. Being overweight or obese increases your risk of developing certain health problems, such as heart disease, diabetes, sleeping problems, joint problems, and some types of cancer.  Unhealthy weight gain is often the result of making unhealthy food choices or not getting enough exercise. You can make changes to your lifestyle to prevent obesity and stay as healthy as possible.  What nutrition changes can be made?    Eat only as much as your body needs. To do this:  Pay attention to signs that you are hungry or full. Stop eating as soon as you feel full.  If you feel hungry, try drinking water first before eating. Drink enough water so your urine is clear or pale yellow.  Eat smaller portions. Pay attention to portion sizes when eating out.  Look at serving sizes on food labels. Most foods contain more than one serving per container.  Eat the recommended number of calories for your gender and activity level. For most active people, a daily total of 2,000 calories is appropriate. If you are trying to lose weight or are not very active, you may need to eat fewer calories. Talk with your health care provider or a diet and nutrition specialist (dietitian) about how many calories you need each day.  Choose healthy foods, such as:  Fruits and vegetables. At each meal, try to fill at least half of your plate with fruits and vegetables.  Whole grains, such as whole-wheat bread, brown rice, and quinoa.  Lean meats, such as chicken or fish.  Other healthy proteins, such as beans, eggs, or tofu.  Healthy fats, such as nuts, seeds, fatty fish, and olive oil.  Low-fat or fat-free dairy products.  Check food labels, and avoid food and drinks that:  Are high in calories.  Have added sugar.  Are high in sodium.  Have saturated fats or trans fats.  Cook foods  in healthier ways, such as by baking, broiling, or grilling.  Make a meal plan for the week, and shop with a grocery list to help you stay on track with your purchases. Try to avoid going to the grocery store when you are hungry.  When grocery shopping, try to shop around the outside of the store first, where the fresh foods are. Doing this helps you to avoid prepackaged foods, which can be high in sugar, salt (sodium), and fat.  What lifestyle changes can be made?    Exercise for 30 or more minutes on 5 or more days each week. Exercising may include brisk walking, yard work, biking, running, swimming, and team sports like basketball and soccer. Ask your health care provider which exercises are safe for you.  Do muscle-strengthening activities, such as lifting weights or using resistance bands, on 2 or more days a week.  Do not use any products that contain nicotine or tobacco, such as cigarettes and e-cigarettes. If you need help quitting, ask your health care provider.  Limit alcohol intake to no more than 1 drink a day for nonpregnant women and 2 drinks a day for men. One drink equals 12 oz of beer, 5 oz of wine, or 1 oz of hard liquor.  Try to get 7-9 hours of sleep each night.  What other changes can be made?  Keep a food and activity journal to keep track of:    What you ate and how many calories you had. Remember to count the calories in sauces, dressings, and side dishes.  Whether you were active, and what exercises you did.  Your calorie, weight, and activity goals.  Check your weight regularly. Track any changes. If you notice you have gained weight, make changes to your diet or activity routine.  Avoid taking weight-loss medicines or supplements. Talk to your health care provider before starting any new medicine or supplement.  Talk to your health care provider before trying any new diet or exercise plan.  Why are these changes important?  Eating healthy, staying active, and having healthy habits can help  you to prevent obesity. Those changes also:  Help you manage stress and emotions.  Help you connect with friends and family.  Improve your self-esteem.  Improve your sleep.  Prevent long-term health problems.  What can happen if changes are not made?  Being obese or overweight can cause you to develop joint or bone problems, which can make it hard for you to stay active or do activities you enjoy. Being obese or overweight also puts stress on your heart and lungs and can lead to health problems like diabetes, heart disease, and some cancers.  Where to find more information  Talk with your health care provider or a dietitian about healthy eating and healthy lifestyle choices. You may also find information from:  [place] Department of Agriculture, MyPlate: www.choosemyplate.gov  American Heart Association: www.heart.org  Centers for Disease Control and Prevention: www.cdc.gov  Summary  Staying at a healthy weight is important to your overall health. It helps you to prevent certain diseases and health problems, such as heart disease, diabetes, joint problems, sleep disorders, and some types of cancer.  Being obese or overweight can cause you to develop joint or bone problems, which can make it hard for you to stay active or do activities you enjoy.  You can prevent unhealthy weight gain by eating a healthy diet, exercising regularly, not smoking, limiting alcohol, and getting enough sleep.  Talk with your health care provider or a dietitian for guidance about healthy eating and healthy lifestyle choices.  This information is not intended to replace advice given to you by your health care provider. Make sure you discuss any questions you have with your health care provider.  Document Released: 08/23/2016 Document Revised: 06/02/2017 Document Reviewed: 09/28/2016  Elsevier Interactive Patient Education  2019 [employer] Inc[employer]

## 2018-12-31 NOTE — Progress Notes (Signed)
Subjective:     Patient ID: Loretta Norris , female    DOB: 30-Mar-1971 , 48 y.o.   MRN: [id number]   Chief Complaint  Patient presents with  . Wellbutrin f/u    HPI  She is here today for f/u smoking cessation. She was started on Wellbutrin XL 150mg  daily. She has taken the medication without any issues. She is still smoking unfortunately. She is down to no more than four to five cigs per day.   Unfortunately, her aunt passed last week. This has stressed her out as well. Her aunt lived in [place]. Due to the COVID-19 pandemic, she was unable to go to [place] to be with her family.     Past Medical History:  Diagnosis Date  . Hypertension      Family History  Problem Relation Age of Onset  . Asthma Mother   . Hypertension Mother      Current Outpatient Medications:  .  cetirizine (ZYRTEC) 10 MG tablet, Take 10 mg by mouth daily., Disp: , Rfl:  .  Cyanocobalamin (VITAMIN B-12 PO), Take 1 tablet by mouth daily., Disp: , Rfl:  .  fexofenadine (ALLEGRA) 180 MG tablet, Take 1 tablet (180 mg total) by mouth daily., Disp: 30 tablet, Rfl: 2 .  Fluocinonide 0.1 % CREA, Apply 2-3 g topically 3 (three) times daily., Disp: , Rfl: 12 .  Ginger, Zingiber officinalis, (GINGER PO), Take 1 capsule by mouth daily., Disp: , Rfl:  .  HYDROcodone-acetaminophen (NORCO/VICODIN) 5-325 MG per tablet, Take 1 tablet by mouth every 6 (six) hours as needed., Disp: 30 tablet, Rfl: 0 .  ibuprofen (ADVIL,MOTRIN) 200 MG tablet, Take 600 mg by mouth every 8 (eight) hours as needed for cramping (menstrual cramping)., Disp: , Rfl:  .  OVER THE COUNTER MEDICATION, Take 8.6 mg by mouth daily. Vegetable Laxative, Disp: , Rfl:  .  telmisartan-hydrochlorothiazide (MICARDIS HCT) 80-12.5 MG tablet, telmisartan 80 mg-hydrochlorothiazide 12.5 mg tablet, Disp: , Rfl:  .  triamcinolone cream (KENALOG) 0.1 %, Apply 1 application topically 2 (two) times daily as needed. For Eczema, Disp: , Rfl:  .  valACYclovir (VALTREX) 500 MG  tablet, Take 500 mg by mouth daily as needed (prevent flare ups). , Disp: , Rfl:  .  buPROPion (WELLBUTRIN XL) 300 MG 24 hr tablet, Take 1 tablet (300 mg total) by mouth every morning., Disp: 30 tablet, Rfl: 2   Allergies  Allergen Reactions  . Naproxen Sodium Hives and Itching    Can tolerate Ibuprofen OKI     Review of Systems  Constitutional: Negative.   Respiratory: Negative.   Cardiovascular: Negative.   Gastrointestinal: Negative.   Neurological: Negative.   Psychiatric/Behavioral: Negative.      Today's Vitals   12/31/18 0917  BP: 126/84  Pulse: (!) 53  Temp: 97.7 F (36.5 C)  TempSrc: Oral  Weight: 200 lb 3.2 oz (90.8 kg)  Height: 5' 0.6" (1.539 m)   Body mass index is 38.33 kg/m.   Objective:  Physical Exam Vitals signs and nursing note reviewed.  Constitutional:      Appearance: Normal appearance.  HENT:     Head: Normocephalic and atraumatic.  Cardiovascular:     Rate and Rhythm: Normal rate and regular rhythm.     Heart sounds: Normal heart sounds.  Pulmonary:     Effort: Pulmonary effort is normal.     Breath sounds: Normal breath sounds.  Skin:    General: Skin is warm.  Neurological:     General:  No focal deficit present.     Mental Status: She is alert.  Psychiatric:        Mood and Affect: Mood normal.        Behavior: Behavior normal.         Assessment And Plan:     1. Cigarette nicotine dependence without complication  Her goal is to quit smoking by June 1st. I will increase Wellbutrin XL to 300mg  once daily. She is encouraged to decrease number of cigs smoked on a weekly basis. She may also contact 800-QUIT-NOW to help with cravings. She will rto in four weeks for re-evaluation.   2. Weight gain  She was advised of 8 pound weight gain in the past month or so. She is encouraged to incorporate more exercise into her daily routine. She is advised to aim for at least 30 minutes five days per week.   3. Grief  She does not wish to  pursue grief counseling at this time.   4. Class 2 obesity due to excess calories without serious comorbidity with body mass index (BMI) of 38.0 to 38.9 in adult  Importance of achieving optimal weight to decrease risk of cardiovascular disease and cancers was discussed with the patient in full detail. She is encouraged to start slowly - start with 10 minutes twice daily at least three to four days per week and to gradually build to 30 minutes five days weekly. She was given tips to incorporate more activity into her daily routine - take stairs when possible, park farther away from her job, grocery stores, etc.   Maximino Greenland, MD    THE PATIENT IS ENCOURAGED TO PRACTICE SOCIAL DISTANCING DUE TO THE COVID-19 PANDEMIC.

## 2019-02-18 ENCOUNTER — Encounter: Payer: Self-pay | Admitting: Internal Medicine

## 2019-02-19 ENCOUNTER — Ambulatory Visit: Payer: Self-pay | Admitting: Nurse Practitioner

## 2019-02-19 ENCOUNTER — Ambulatory Visit: Payer: BC Managed Care – PPO | Admitting: Internal Medicine

## 2019-03-06 ENCOUNTER — Ambulatory Visit: Payer: BC Managed Care – PPO | Admitting: Internal Medicine

## 2019-03-26 ENCOUNTER — Encounter: Payer: Self-pay | Admitting: Internal Medicine

## 2019-04-02 ENCOUNTER — Other Ambulatory Visit: Payer: Self-pay

## 2019-04-02 ENCOUNTER — Encounter: Payer: Self-pay | Admitting: Internal Medicine

## 2019-04-02 ENCOUNTER — Ambulatory Visit: Payer: BC Managed Care – PPO | Admitting: Internal Medicine

## 2019-04-02 VITALS — BP 140/98 | HR 86 | Temp 98.1°F | Ht 60.6 in | Wt 198.6 lb

## 2019-04-02 DIAGNOSIS — I10 Essential (primary) hypertension: Secondary | ICD-10-CM | POA: Diagnosis not present

## 2019-04-02 DIAGNOSIS — Z79899 Other long term (current) drug therapy: Secondary | ICD-10-CM | POA: Diagnosis not present

## 2019-04-02 DIAGNOSIS — Z6838 Body mass index (BMI) 38.0-38.9, adult: Secondary | ICD-10-CM

## 2019-04-02 DIAGNOSIS — F17210 Nicotine dependence, cigarettes, uncomplicated: Secondary | ICD-10-CM

## 2019-04-02 MED ORDER — TRIAMCINOLONE ACETONIDE 0.1 % EX CREA: TOPICAL_CREAM | CUTANEOUS | 2 refills | Status: DC

## 2019-04-02 MED ORDER — HYDROCHLOROTHIAZIDE 12.5 MG PO CAPS: 12.5000 mg | ORAL_CAPSULE | Freq: Every day | ORAL | 1 refills | Status: DC

## 2019-04-02 NOTE — Patient Instructions (Signed)
Tobacco Use Disorder Tobacco use disorder (TUD) occurs when a person craves, seeks, and uses tobacco, regardless of the consequences. This disorder can cause problems with mental and physical health. It can affect your ability to have healthy relationships, and it can keep you from meeting your responsibilities at work, home, or school. Tobacco may be:  Smoked as a cigarette or cigar.  Inhaled using e-cigarettes.  Smoked in a pipe or hookah.  Chewed as smokeless tobacco.  Inhaled into the nostrils as snuff. Tobacco products contain a dangerous chemical called nicotine, which is very addictive. Nicotine triggers hormones that make the body feel stimulated and works on areas of the brain that make you feel good. These effects can make it hard for people to quit nicotine. Tobacco contains many other unsafe chemicals that can damage almost every organ in the body. Smoking tobacco also puts others in danger due to fire risk and possible health problems caused by breathing in secondhand smoke. What are the signs or symptoms? Symptoms of TUD may include:  Being unable to slow down or stop your tobacco use.  Spending an abnormal amount of time getting or using tobacco.  Craving tobacco. Cravings may last for up to 6 months after quitting.  Tobacco use that: ? Interferes with your work, school, or home life. ? Interferes with your personal and social relationships. ? Makes you give up activities that you once enjoyed or found important.  Using tobacco even though you know that it is: ? Dangerous or bad for your health or someone else's health. ? Causing problems in your life.  Needing more and more of the substance to get the same effect (developing tolerance).  Experiencing unpleasant symptoms if you do not use the substance (withdrawal). Withdrawal symptoms may include: ? Depressed, anxious, or irritable mood. ? Difficulty concentrating. ? Increased appetite. ? Restlessness or trouble  sleeping.  Using the substance to avoid withdrawal. How is this diagnosed? This condition may be diagnosed based on:  Your current and past tobacco use. Your health care provider may ask questions about how your tobacco use affects your life.  A physical exam. You may be diagnosed with TUD if you have at least two symptoms within a 12-month period. How is this treated? This condition is treated by stopping tobacco use. Many people are unable to quit on their own and need help. Treatment may include:  Nicotine replacement therapy (NRT). NRT provides nicotine without the other harmful chemicals in tobacco. NRT gradually lowers the dosage of nicotine in the body and reduces withdrawal symptoms. NRT is available as: ? Over-the-counter gums, lozenges, and skin patches. ? Prescription mouth inhalers and nasal sprays.  Medicine that acts on the brain to reduce cravings and withdrawal symptoms.  A type of talk therapy that examines your triggers for tobacco use, how to avoid them, and how to cope with cravings (behavioral therapy).  Hypnosis. This may help with withdrawal symptoms.  Joining a support group for others coping with TUD. The best treatment for TUD is usually a combination of medicine, talk therapy, and support groups. Recovery can be a long process. Many people start using tobacco again after stopping (relapse). If you relapse, it does not mean that treatment will not work. Follow these instructions at home:  Lifestyle  Do not use any products that contain nicotine or tobacco, such as cigarettes and e-cigarettes.  Avoid things that trigger tobacco use as much as you can. Triggers include people and situations that usually cause you   to use tobacco.  Avoid drinks that contain caffeine, including coffee. These may worsen some withdrawal symptoms.  Find ways to manage stress. Wanting to smoke may cause stress, and stress can make you want to smoke. Relaxation techniques such as  deep breathing, meditation, and yoga may help.  Attend support groups as needed. These groups are an important part of long-term recovery for many people. General instructions  Take over-the-counter and prescription medicines only as told by your health care provider.  Check with your health care provider before taking any new prescription or over-the-counter medicines.  Decide on a friend, family member, or smoking quit-line (such as 1-800-QUIT-NOW in the [place] that you can call or text when you feel the urge to smoke or when you need help coping with cravings.  Keep all follow-up visits as told by your health care provider and therapist. This is important. Contact a health care provider if:  You are not able to take your medicines as prescribed.  Your symptoms get worse, even with treatment. Summary  Tobacco use disorder (TUD) occurs when a person craves, seeks, and uses tobacco regardless of the consequences.  This condition may be diagnosed based on your current and past tobacco use and a physical exam.  Many people are unable to quit on their own and need help. Recovery can be a long process.  The most effective treatment for TUD is usually a combination of medicine, talk therapy, and support groups. This information is not intended to replace advice given to you by your health care provider. Make sure you discuss any questions you have with your health care provider. Document Released: 04/27/2004 Document Revised: 08/09/2017 Document Reviewed: 08/09/2017 Elsevier Patient Education  2020 [employer].  

## 2019-04-02 NOTE — Progress Notes (Signed)
Subjective:     Patient ID: Loretta Norris , female    DOB: Oct 17, 1970 , 48 y.o.   MRN: [id number]   Chief Complaint  Patient presents with  . Hypertension  . Wellbutrin f/u  . Covid testing    HPI  She is here today for f/u htn. She reports compliance with meds. States she is stressed at work. She works at [employer]. There are several students on campus that are positive. She works in Electrical engineer.  She denies having any symptoms. She wants to be tested for COVID today.   Additionally, she has been taking Bupropion XL 300mg  daily for smoking cessation. Unfortunately, this has been ineffective at decreasing her cravings for nicotine. She is currently smoking 2-3 cigs per day.     Past Medical History:  Diagnosis Date  . Hypertension      Family History  Problem Relation Age of Onset  . Asthma Mother   . Hypertension Mother      Current Outpatient Medications:  .  buPROPion (WELLBUTRIN XL) 300 MG 24 hr tablet, Take 1 tablet (300 mg total) by mouth every morning., Disp: 30 tablet, Rfl: 2 .  Cyanocobalamin (VITAMIN B-12 PO), Take 1 tablet by mouth daily., Disp: , Rfl:  .  fexofenadine (ALLEGRA) 180 MG tablet, Take 1 tablet (180 mg total) by mouth daily., Disp: 30 tablet, Rfl: 2 .  Ginger, Zingiber officinalis, (GINGER PO), Take 1 capsule by mouth daily., Disp: , Rfl:  .  HYDROcodone-acetaminophen (NORCO/VICODIN) 5-325 MG per tablet, Take 1 tablet by mouth every 6 (six) hours as needed., Disp: 30 tablet, Rfl: 0 .  ibuprofen (ADVIL,MOTRIN) 200 MG tablet, Take 600 mg by mouth every 8 (eight) hours as needed for cramping (menstrual cramping)., Disp: , Rfl:  .  telmisartan-hydrochlorothiazide (MICARDIS HCT) 80-12.5 MG tablet, telmisartan 80 mg-hydrochlorothiazide 12.5 mg tablet, Disp: , Rfl:  .  valACYclovir (VALTREX) 500 MG tablet, Take 500 mg by mouth daily as needed (prevent flare ups). , Disp: , Rfl:  .  hydrochlorothiazide (MICROZIDE) 12.5 MG capsule, Take 1 capsule (12.5 mg total) by  mouth daily., Disp: 30 capsule, Rfl: 1 .  OVER THE COUNTER MEDICATION, Take 8.6 mg by mouth daily. Vegetable Laxative, Disp: , Rfl:  .  triamcinolone cream (KENALOG) 0.1 %, For Eczema, Disp: 30 g, Rfl: 2   Allergies  Allergen Reactions  . Naproxen Sodium Hives and Itching    Can tolerate Ibuprofen OKI     Review of Systems  Constitutional: Negative.   Respiratory: Negative.   Cardiovascular: Negative.   Gastrointestinal: Negative.   Neurological: Negative.   Psychiatric/Behavioral: Negative.      Today's Vitals   04/02/19 1444  BP: (!) 140/98  Pulse: 86  Temp: 98.1 F (36.7 C)  TempSrc: Oral  Weight: 198 lb 9.6 oz (90.1 kg)  Height: 5' 0.6" (1.539 m)   Body mass index is 38.02 kg/m.   Objective:  Physical Exam Vitals signs and nursing note reviewed.  Constitutional:      Appearance: Normal appearance.  HENT:     Head: Normocephalic and atraumatic.  Cardiovascular:     Rate and Rhythm: Normal rate and regular rhythm.     Heart sounds: Normal heart sounds.  Pulmonary:     Effort: Pulmonary effort is normal.     Breath sounds: Normal breath sounds.  Skin:    General: Skin is warm.  Neurological:     General: No focal deficit present.     Mental Status: She  is alert.  Psychiatric:        Mood and Affect: Mood normal.        Behavior: Behavior normal.         Assessment And Plan:     1. Essential hypertension, benign  Uncontrolled. I will add HCTZ 12.5mg  to her current regimen. Once she is done with current supply of telmisartan/hctz, I will send in a new dose 80/25mg . She agrees to come in four weeks for re-evaluation. I will check a BMP at that time.   2. Cigarette nicotine dependence without complication  Pt congratulated for cutting back to 2-3 cigs per day. I suggest that we switch to Chantix at her next visit. She is in agreement with her treatment plan.   3. Class 2 severe obesity due to excess calories with serious comorbidity and body mass index  (BMI) of 38.0 to 38.9 in adult [facility])  Importance of achieving optimal weight to decrease risk of cardiovascular disease and cancers was discussed with the patient in full detail. She is encouraged to start slowly - start with 10 minutes twice daily at least three to four days per week and to gradually build to 30 minutes five days weekly. She was given tips to incorporate more activity into her daily routine - take stairs when possible, park farther away from her job, grocery stores, etc.    Maximino Greenland, MD    THE PATIENT IS ENCOURAGED TO PRACTICE SOCIAL DISTANCING DUE TO THE COVID-19 PANDEMIC.

## 2019-04-04 LAB — NOVEL CORONAVIRUS, NAA: SARS-CoV-2, NAA: NOT DETECTED

## 2019-04-10 ENCOUNTER — Other Ambulatory Visit: Payer: Self-pay

## 2019-04-10 MED ORDER — TRIAMCINOLONE ACETONIDE 0.1 % EX CREA: TOPICAL_CREAM | CUTANEOUS | 2 refills | Status: DC

## 2019-04-29 ENCOUNTER — Encounter: Payer: Self-pay | Admitting: Internal Medicine

## 2019-04-30 ENCOUNTER — Ambulatory Visit: Payer: BC Managed Care – PPO | Admitting: Internal Medicine

## 2019-05-14 ENCOUNTER — Other Ambulatory Visit: Payer: Self-pay

## 2019-05-14 DIAGNOSIS — Z20822 Contact with and (suspected) exposure to covid-19: Secondary | ICD-10-CM

## 2019-05-16 LAB — NOVEL CORONAVIRUS, NAA: SARS-CoV-2, NAA: NOT DETECTED

## 2019-06-03 ENCOUNTER — Ambulatory Visit: Payer: BC Managed Care – PPO | Admitting: Internal Medicine

## 2019-06-03 ENCOUNTER — Other Ambulatory Visit: Payer: Self-pay

## 2019-06-03 ENCOUNTER — Encounter: Payer: Self-pay | Admitting: Internal Medicine

## 2019-06-03 VITALS — BP 138/84 | HR 84 | Temp 98.8°F | Ht 61.8 in | Wt 199.4 lb

## 2019-06-03 DIAGNOSIS — F17210 Nicotine dependence, cigarettes, uncomplicated: Secondary | ICD-10-CM

## 2019-06-03 DIAGNOSIS — E6609 Other obesity due to excess calories: Secondary | ICD-10-CM | POA: Diagnosis not present

## 2019-06-03 DIAGNOSIS — Z23 Encounter for immunization: Secondary | ICD-10-CM

## 2019-06-03 DIAGNOSIS — Z6836 Body mass index (BMI) 36.0-36.9, adult: Secondary | ICD-10-CM

## 2019-06-03 DIAGNOSIS — I10 Essential (primary) hypertension: Secondary | ICD-10-CM

## 2019-06-03 MED ORDER — TELMISARTAN-HCTZ 80-25 MG PO TABS: 1.0000 | ORAL_TABLET | Freq: Every day | ORAL | 2 refills | Status: DC

## 2019-06-03 MED ORDER — BUPROPION HCL ER (XL) 150 MG PO TB24: 150.0000 mg | ORAL_TABLET | Freq: Every day | ORAL | 0 refills | Status: DC

## 2019-06-03 NOTE — Progress Notes (Signed)
Subjective:     Patient ID: Loretta Norris , female    DOB: 04/28/1971 , 48 y.o.   MRN: [id number]   Chief Complaint  Patient presents with  . Hypertension    f/u    HPI  Hypertension This is a chronic problem. The current episode started more than 1 year ago. The problem has been gradually improving since onset. The problem is controlled. Pertinent negatives include no blurred vision, chest pain, palpitations or shortness of breath. Risk factors for coronary artery disease include obesity, sedentary lifestyle and smoking/tobacco exposure. The current treatment provides moderate improvement. Compliance problems include exercise.      Past Medical History:  Diagnosis Date  . Hypertension      Family History  Problem Relation Age of Onset  . Asthma Mother   . Hypertension Mother      Current Outpatient Medications:  .  Cyanocobalamin (VITAMIN B-12 PO), Take 1 tablet by mouth daily., Disp: , Rfl:  .  fexofenadine (ALLEGRA) 180 MG tablet, Take 1 tablet (180 mg total) by mouth daily., Disp: 30 tablet, Rfl: 2 .  HYDROcodone-acetaminophen (NORCO/VICODIN) 5-325 MG per tablet, Take 1 tablet by mouth every 6 (six) hours as needed., Disp: 30 tablet, Rfl: 0 .  ibuprofen (ADVIL,MOTRIN) 200 MG tablet, Take 600 mg by mouth every 8 (eight) hours as needed for cramping (menstrual cramping)., Disp: , Rfl:  .  OVER THE COUNTER MEDICATION, Take 8.6 mg by mouth daily. Vegetable Laxative, Disp: , Rfl:  .  triamcinolone cream (KENALOG) 0.1 %, APPLY TO AFFECTED AREA TWICE A DAY AS NEEDED., Disp: 30 g, Rfl: 2 .  valACYclovir (VALTREX) 500 MG tablet, Take 500 mg by mouth daily as needed (prevent flare ups). , Disp: , Rfl:  .  buPROPion (WELLBUTRIN XL) 150 MG 24 hr tablet, Take 1 tablet (150 mg total) by mouth daily., Disp: 30 tablet, Rfl: 0 .  Ginger, Zingiber officinalis, (GINGER PO), Take 1 capsule by mouth daily., Disp: , Rfl:  .  telmisartan-hydrochlorothiazide (MICARDIS HCT) 80-25 MG tablet, Take 1  tablet by mouth daily., Disp: 90 tablet, Rfl: 2   Allergies  Allergen Reactions  . Naproxen Sodium Hives and Itching    Can tolerate Ibuprofen OKI     Review of Systems  Constitutional: Negative.   Eyes: Negative for blurred vision.  Respiratory: Negative.  Negative for shortness of breath.   Cardiovascular: Negative.  Negative for chest pain and palpitations.  Gastrointestinal: Negative.   Neurological: Negative.   Psychiatric/Behavioral: Negative.      Today's Vitals   06/03/19 0907  BP: 138/84  Pulse: 84  Temp: 98.8 F (37.1 C)  TempSrc: Oral  Weight: 199 lb 6.4 oz (90.4 kg)  Height: 5' 1.8" (1.57 m)   Body mass index is 36.71 kg/m.   Objective:  Physical Exam Vitals signs and nursing note reviewed.  Constitutional:      Appearance: Normal appearance.  HENT:     Head: Normocephalic and atraumatic.  Cardiovascular:     Rate and Rhythm: Normal rate and regular rhythm.     Heart sounds: Normal heart sounds.  Pulmonary:     Effort: Pulmonary effort is normal.     Breath sounds: Normal breath sounds.  Skin:    General: Skin is warm.  Neurological:     General: No focal deficit present.     Mental Status: She is alert.  Psychiatric:        Mood and Affect: Mood normal.  Behavior: Behavior normal.         Assessment And Plan:     1. Essential hypertension, benign  Chronic, somewhat improved. I expect it to improve greatly once she has stopped smoking. A new rx for telmisartan/hctz 80/24m once daily was sent to the pharmacy.   - BMP8+EGFR  2. Cigarette nicotine dependence without complication  She reports that Wellbutrin XL has not helped decrease her cravings for cigarettes. She wants to try Chantix. I will decrease dose to 1572mdaily first, prior to stopping the medication. She verbalizes understanding of her treatment plan. After two weeks at this dose, she will change to MWF dosing and then she will stop the medication. At that time, I will  switch her to Chantix, starter pack. She will rto four weeks after starting Chantix.  All questions were answered to her satisfaction.   3. Flu vaccine need  She was given flu vaccine to update immunizations.   4. Class 2 obesity due to excess calories without serious comorbidity with body mass index (BMI) of 36.0 to 36.9 in adult  She is encouraged to strive for BMI less than 30 to decrease cardiac risk. She states she is now back at the gym.  She is encouraged to exercise no less than 30 minutes five days weekly.        RoMaximino GreenlandMD    THE PATIENT IS ENCOURAGED TO PRACTICE SOCIAL DISTANCING DUE TO THE COVID-19 PANDEMIC.

## 2019-06-03 NOTE — Patient Instructions (Signed)
Coping with Quitting Smoking  Quitting smoking is a physical and mental challenge. You will face cravings, withdrawal symptoms, and temptation. Before quitting, work with your health care provider to make a plan that can help you cope. Preparation can help you quit and keep you from giving in. How can I cope with cravings? Cravings usually last for 5-10 minutes. If you get through it, the craving will pass. Consider taking the following actions to help you cope with cravings:  Keep your mouth busy: ? Chew sugar-free gum. ? Suck on hard candies or a straw. ? Brush your teeth.  Keep your hands and body busy: ? Immediately change to a different activity when you feel a craving. ? Squeeze or play with a ball. ? Do an activity or a hobby, like making bead jewelry, practicing needlepoint, or working with wood. ? Mix up your normal routine. ? Take a short exercise break. Go for a quick walk or run up and down stairs. ? Spend time in public places where smoking is not allowed.  Focus on doing something kind or helpful for someone else.  Call a friend or family member to talk during a craving.  Join a support group.  Call a quit line, such as 1-800-QUIT-NOW.  Talk with your health care provider about medicines that might help you cope with cravings and make quitting easier for you. How can I deal with withdrawal symptoms? Your body may experience negative effects as it tries to get used to not having nicotine in the system. These effects are called withdrawal symptoms. They may include:  Feeling hungrier than normal.  Trouble concentrating.  Irritability.  Trouble sleeping.  Feeling depressed.  Restlessness and agitation.  Craving a cigarette. To manage withdrawal symptoms:  Avoid places, people, and activities that trigger your cravings.  Remember why you want to quit.  Get plenty of sleep.  Avoid coffee and other caffeinated drinks. These may worsen some of your symptoms.  How can I handle social situations? Social situations can be difficult when you are quitting smoking, especially in the first few weeks. To manage this, you can:  Avoid parties, bars, and other social situations where people might be smoking.  Avoid alcohol.  Leave right away if you have the urge to smoke.  Explain to your family and friends that you are quitting smoking. Ask for understanding and support.  Plan activities with friends or family where smoking is not an option. What are some ways I can cope with stress? Wanting to smoke may cause stress, and stress can make you want to smoke. Find ways to manage your stress. Relaxation techniques can help. For example:  Breathe slowly and deeply, in through your nose and out through your mouth.  Listen to soothing, relaxing music.  Talk with a family member or friend about your stress.  Light a candle.  Soak in a bath or take a shower.  Think about a peaceful place. What are some ways I can prevent weight gain? Be aware that many people gain weight after they quit smoking. However, not everyone does. To keep from gaining weight, have a plan in place before you quit and stick to the plan after you quit. Your plan should include:  Having healthy snacks. When you have a craving, it may help to: ? Eat plain popcorn, crunchy carrots, celery, or other cut vegetables. ? Chew sugar-free gum.  Changing how you eat: ? Eat small portion sizes at meals. ? Eat 4-6 small meals   throughout the day instead of 1-2 large meals a day. ? Be mindful when you eat. Do not watch television or do other things that might distract you as you eat.  Exercising regularly: ? Make time to exercise each day. If you do not have time for a long workout, do short bouts of exercise for 5-10 minutes several times a day. ? Do some form of strengthening exercise, like weight lifting, and some form of aerobic exercise, like running or swimming.  Drinking plenty of  water or other low-calorie or no-calorie drinks. Drink 6-8 glasses of water daily, or as much as instructed by your health care provider. Summary  Quitting smoking is a physical and mental challenge. You will face cravings, withdrawal symptoms, and temptation to smoke again. Preparation can help you as you go through these challenges.  You can cope with cravings by keeping your mouth busy (such as by chewing gum), keeping your body and hands busy, and making calls to family, friends, or a helpline for people who want to quit smoking.  You can cope with withdrawal symptoms by avoiding places where people smoke, avoiding drinks with caffeine, and getting plenty of rest.  Ask your health care provider about the different ways to prevent weight gain, avoid stress, and handle social situations. This information is not intended to replace advice given to you by your health care provider. Make sure you discuss any questions you have with your health care provider. Document Released: 08/19/2016 Document Revised: 08/04/2017 Document Reviewed: 08/19/2016 Elsevier Patient Education  2020 Elsevier Inc[employer]  

## 2019-06-04 LAB — BMP8+EGFR
BUN/Creatinine Ratio: 18 (ref 9–23)
BUN: 13 mg/dL (ref 6–24)
CO2: 23 mmol/L (ref 20–29)
Calcium: 9 mg/dL (ref 8.7–10.2)
Chloride: 105 mmol/L (ref 96–106)
Creatinine, Ser: 0.72 mg/dL (ref 0.57–1.00)
GFR calc Af Amer: 114 mL/min/{1.73_m2} (ref >59)
GFR calc non Af Amer: 99 mL/min/{1.73_m2} (ref >59)
Glucose: 89 mg/dL (ref 65–99)
Potassium: 4.4 mmol/L (ref 3.5–5.2)
Sodium: 141 mmol/L (ref 134–144)

## 2019-06-13 ENCOUNTER — Encounter: Payer: Self-pay | Admitting: Internal Medicine

## 2019-06-15 ENCOUNTER — Other Ambulatory Visit: Payer: Self-pay

## 2019-06-15 MED ORDER — FEXOFENADINE HCL 180 MG PO TABS: 180.0000 mg | ORAL_TABLET | Freq: Every day | ORAL | 2 refills | Status: DC

## 2019-08-06 ENCOUNTER — Ambulatory Visit: Payer: BC Managed Care – PPO | Admitting: Sports Medicine

## 2019-08-09 ENCOUNTER — Other Ambulatory Visit: Payer: Self-pay

## 2019-08-09 DIAGNOSIS — Z20822 Contact with and (suspected) exposure to covid-19: Secondary | ICD-10-CM

## 2019-08-10 ENCOUNTER — Ambulatory Visit: Payer: BC Managed Care – PPO | Admitting: Podiatry

## 2019-08-12 ENCOUNTER — Ambulatory Visit: Payer: BC Managed Care – PPO | Admitting: Internal Medicine

## 2019-08-12 ENCOUNTER — Encounter: Payer: Self-pay | Admitting: Internal Medicine

## 2019-08-12 LAB — NOVEL CORONAVIRUS, NAA: SARS-CoV-2, NAA: NOT DETECTED

## 2019-08-13 ENCOUNTER — Encounter: Payer: Self-pay | Admitting: Internal Medicine

## 2019-08-22 ENCOUNTER — Other Ambulatory Visit: Payer: Self-pay

## 2019-08-22 ENCOUNTER — Encounter: Payer: Self-pay | Admitting: Internal Medicine

## 2019-08-22 ENCOUNTER — Ambulatory Visit: Payer: BC Managed Care – PPO | Admitting: Internal Medicine

## 2019-08-22 VITALS — BP 124/80 | HR 57 | Temp 98.1°F | Ht 61.8 in | Wt 197.2 lb

## 2019-08-22 DIAGNOSIS — Z0100 Encounter for examination of eyes and vision without abnormal findings: Secondary | ICD-10-CM

## 2019-08-22 DIAGNOSIS — Z139 Encounter for screening, unspecified: Secondary | ICD-10-CM

## 2019-08-22 DIAGNOSIS — Z0289 Encounter for other administrative examinations: Secondary | ICD-10-CM | POA: Diagnosis not present

## 2019-08-22 DIAGNOSIS — F17210 Nicotine dependence, cigarettes, uncomplicated: Secondary | ICD-10-CM | POA: Diagnosis not present

## 2019-08-22 DIAGNOSIS — Z0110 Encounter for examination of ears and hearing without abnormal findings: Secondary | ICD-10-CM | POA: Diagnosis not present

## 2019-08-22 DIAGNOSIS — I10 Essential (primary) hypertension: Secondary | ICD-10-CM | POA: Diagnosis not present

## 2019-08-22 DIAGNOSIS — Z716 Tobacco abuse counseling: Secondary | ICD-10-CM

## 2019-08-22 LAB — POCT URINALYSIS DIPSTICK
Bilirubin, UA: NEGATIVE
Blood, UA: NEGATIVE
Glucose, UA: NEGATIVE
Ketones, UA: NEGATIVE
Leukocytes, UA: NEGATIVE
Nitrite, UA: NEGATIVE
Protein, UA: NEGATIVE
Spec Grav, UA: 1.01 (ref 1.010–1.025)
Urobilinogen, UA: 0.2 E.U./dL
pH, UA: 5.5 (ref 5.0–8.0)

## 2019-08-22 MED ORDER — BUPROPION HCL ER (XL) 150 MG PO TB24: 150.0000 mg | ORAL_TABLET | Freq: Every day | ORAL | 0 refills | Status: DC

## 2019-08-22 MED ORDER — CHANTIX STARTING MONTH PAK 0.5 MG X 11 & 1 MG X 42 PO TABS: ORAL_TABLET | ORAL | 0 refills | Status: DC

## 2019-08-22 MED ORDER — FEXOFENADINE HCL 180 MG PO TABS: 180.0000 mg | ORAL_TABLET | Freq: Every day | ORAL | 2 refills | Status: DC

## 2019-08-22 NOTE — Patient Instructions (Signed)
Varenicline oral tablets What is this medicine? VARENICLINE (var e NI kleen) is used to help people quit smoking. It is used with a patient support program recommended by your physician. This medicine may be used for other purposes; ask your health care provider or pharmacist if you have questions. COMMON BRAND NAME(S): Chantix What should I tell my health care provider before I take this medicine? They need to know if you have any of these conditions:  heart disease  if you often drink alcohol  kidney disease  mental illness  on hemodialysis  seizures  history of stroke  suicidal thoughts, plans, or attempt; a previous suicide attempt by you or a family member  an unusual or allergic reaction to varenicline, other medicines, foods, dyes, or preservatives  pregnant or trying to get pregnant  breast-feeding How should I use this medicine? Take this medicine by mouth after eating. Take with a full glass of water. Follow the directions on the prescription label. Take your doses at regular intervals. Do not take your medicine more often than directed. There are 3 ways you can use this medicine to help you quit smoking; talk to your health care professional to decide which plan is right for you: 1) you can choose a quit date and start this medicine 1 week before the quit date, or, 2) you can start taking this medicine before you choose a quit date, and then pick a quit date between day 8 and 35 days of treatment, or, 3) if you are not sure that you are able or willing to quit smoking right away, start taking this medicine and slowly decrease the amount you smoke as directed by your health care professional with the goal of being cigarette-free by week 12 of treatment. Stick to your plan; ask about support groups or other ways to help you remain cigarette-free. If you are motivated to quit smoking and did not succeed during a previous attempt with this medicine for reasons other than  side effects, or if you returned to smoking after this treatment, speak with your health care professional about whether another course of this medicine may be right for you. A special MedGuide will be given to you by the pharmacist with each prescription and refill. Be sure to read this information carefully each time. Talk to your pediatrician regarding the use of this medicine in children. This medicine is not approved for use in children. Overdosage: If you think you have taken too much of this medicine contact a poison control center or emergency room at once. NOTE: This medicine is only for you. Do not share this medicine with others. What if I miss a dose? If you miss a dose, take it as soon as you can. If it is almost time for your next dose, take only that dose. Do not take double or extra doses. What may interact with this medicine?  alcohol  insulin  other medicines used to help people quit smoking  theophylline  warfarin This list may not describe all possible interactions. Give your health care provider a list of all the medicines, herbs, non-prescription drugs, or dietary supplements you use. Also tell them if you smoke, drink alcohol, or use illegal drugs. Some items may interact with your medicine. What should I watch for while using this medicine? It is okay if you do not succeed at your attempt to quit and have a cigarette. You can still continue your quit attempt and keep using this medicine as directed.  Just throw away your cigarettes and get back to your quit plan. Talk to your health care provider before using other treatments to quit smoking. Using this medicine with other treatments to quit smoking may increase the risk for side effects compared to using a treatment alone. You may get drowsy or dizzy. Do not drive, use machinery, or do anything that needs mental alertness until you know how this medicine affects you. Do not stand or sit up quickly, especially if you are  an older patient. This reduces the risk of dizzy or fainting spells. Decrease the number of alcoholic beverages that you drink during treatment with this medicine until you know if this medicine affects your ability to tolerate alcohol. Some people have experienced increased drunkenness (intoxication), unusual or sometimes aggressive behavior, or no memory of things that have happened (amnesia) during treatment with this medicine. Sleepwalking can happen during treatment with this medicine, and can sometimes lead to behavior that is harmful to you, other people, or property. Stop taking this medicine and tell your doctor if you start sleepwalking or have other unusual sleep-related activity. After taking this medicine, you may get up out of bed and do an activity that you do not know you are doing. The next morning, you may have no memory of this. Activities include driving a car ("sleep-driving"), making and eating food, talking on the phone, sexual activity, and sleep-walking. Serious injuries have occurred. Stop the medicine and call your doctor right away if you find out you have done any of these activities. Do not take this medicine if you have used alcohol that evening. Do not take it if you have taken another medicine for sleep. The risk of doing these sleep-related activities is higher. Patients and their families should watch out for new or worsening depression or thoughts of suicide. Also watch out for sudden changes in feelings such as feeling anxious, agitated, panicky, irritable, hostile, aggressive, impulsive, severely restless, overly excited and hyperactive, or not being able to sleep. If this happens, call your health care professional. If you have diabetes and you quit smoking, the effects of insulin may be increased and you may need to reduce your insulin dose. Check with your doctor or health care professional about how you should adjust your insulin dose. What side effects may I notice  from receiving this medicine? Side effects that you should report to your doctor or health care professional as soon as possible:  allergic reactions like skin rash, itching or hives, swelling of the face, lips, tongue, or throat  acting aggressive, being angry or violent, or acting on dangerous impulses  breathing problems  changes in emotions or moods  chest pain or chest tightness  feeling faint or lightheaded, falls  hallucination, loss of contact with reality  mouth sores  redness, blistering, peeling or loosening of the skin, including inside the mouth  signs and symptoms of a stroke like changes in vision; confusion; trouble speaking or understanding; severe headaches; sudden numbness or weakness of the face, arm or leg; trouble walking; dizziness; loss of balance or coordination  seizures  sleepwalking  suicidal thoughts or other mood changes Side effects that usually do not require medical attention (report to your doctor or health care professional if they continue or are bothersome):  constipation  gas  headache  nausea, vomiting  strange dreams  trouble sleeping This list may not describe all possible side effects. Call your doctor for medical advice about side effects. You may report side  effects to FDA at 1-800-FDA-1088. Where should I keep my medicine? Keep out of the reach of children. Store at room temperature between 15 and 30 degrees C (59 and 86 degrees F). Throw away any unused medicine after the expiration date. NOTE: This sheet is a summary. It may not cover all possible information. If you have questions about this medicine, talk to your doctor, pharmacist, or health care provider.  2020 Elsevier/Gold Standard (2018-08-10 14:27:36)  

## 2019-08-23 ENCOUNTER — Encounter: Payer: Self-pay | Admitting: Podiatry

## 2019-08-23 ENCOUNTER — Other Ambulatory Visit: Payer: Self-pay | Admitting: Podiatry

## 2019-08-23 ENCOUNTER — Ambulatory Visit (INDEPENDENT_AMBULATORY_CARE_PROVIDER_SITE_OTHER): Payer: BC Managed Care – PPO | Admitting: Podiatry

## 2019-08-23 ENCOUNTER — Ambulatory Visit (INDEPENDENT_AMBULATORY_CARE_PROVIDER_SITE_OTHER): Payer: BC Managed Care – PPO

## 2019-08-23 VITALS — BP 141/93

## 2019-08-23 DIAGNOSIS — M25571 Pain in right ankle and joints of right foot: Secondary | ICD-10-CM

## 2019-08-23 DIAGNOSIS — M76821 Posterior tibial tendinitis, right leg: Secondary | ICD-10-CM

## 2019-08-23 NOTE — Progress Notes (Signed)
Subjective:  Patient ID: Loretta Norris, female    DOB: April 11, 1971,  MRN: [id number]  Chief Complaint  Patient presents with  . Ankle Pain    pt is here for right ankle pain of the right ankle medial side, pt states that pain has been going on for about 2 years    48 y.o. female presents with the above complaint.  Patient presents with right ankle pain which has been going on for over 2 years.  Patient states the ankle pain is on the medial side.  She had an injury that happened in the past which may have been the ED the etiology of the pain.  She states the pain is elevated when exercising.  Her pain is 6 out of 10.  Patient had a boot from 2 years ago which she has not used much but has brought it with her in case if she needs to put herself in the boot.  She also had orthotics made by her previous orthopedic doctor which she has not been wearing them but has brought it with her.  They are appear to be in good condition.  She denies any other acute complaints.   Review of Systems: Negative except as noted in the HPI. Denies N/V/F/Ch.  Past Medical History:  Diagnosis Date  . Hypertension     Current Outpatient Medications:  .  buPROPion (WELLBUTRIN XL) 150 MG 24 hr tablet, , Disp: , Rfl:  .  Cyanocobalamin (VITAMIN B-12 PO), Take 1 tablet by mouth daily., Disp: , Rfl:  .  fexofenadine (ALLEGRA) 180 MG tablet, Take 1 tablet (180 mg total) by mouth daily., Disp: 30 tablet, Rfl: 2 .  Ginger, Zingiber officinalis, (GINGER PO), Take 1 capsule by mouth daily., Disp: , Rfl:  .  HYDROcodone-acetaminophen (NORCO/VICODIN) 5-325 MG per tablet, Take 1 tablet by mouth every 6 (six) hours as needed., Disp: 30 tablet, Rfl: 0 .  ibuprofen (ADVIL,MOTRIN) 200 MG tablet, Take 600 mg by mouth every 8 (eight) hours as needed for cramping (menstrual cramping)., Disp: , Rfl:  .  OVER THE COUNTER MEDICATION, Take 8.6 mg by mouth daily. Vegetable Laxative, Disp: , Rfl:  .  telmisartan-hydrochlorothiazide  (MICARDIS HCT) 80-25 MG tablet, Take 1 tablet by mouth daily., Disp: 90 tablet, Rfl: 2 .  triamcinolone cream (KENALOG) 0.1 %, APPLY TO AFFECTED AREA TWICE A DAY AS NEEDED., Disp: 30 g, Rfl: 2 .  valACYclovir (VALTREX) 500 MG tablet, Take 500 mg by mouth daily as needed (prevent flare ups). , Disp: , Rfl:  .  varenicline (CHANTIX STARTING MONTH PAK) 0.5 MG X 11 & 1 MG X 42 tablet, Take one 0.5 mg tablet by mouth once daily for 3 days, then increase to one 0.5 mg tablet twice daily for 4 days, then increase to one 1 mg tablet twice daily., Disp: 53 tablet, Rfl: 0  Social History   Tobacco Use  Smoking Status Current Every Day Smoker  . Packs/day: 0.25  . Years: 15.00  . Pack years: 3.75  . Types: Cigarettes  Smokeless Tobacco Never Used    Allergies  Allergen Reactions  . Allevess [Capsaicin-Menthol] Hives and Nausea And Vomiting  . Naproxen Sodium Hives and Itching    Can tolerate Ibuprofen OKI   Objective:   Vitals:   08/23/19 0912  BP: (!) 141/93   There is no height or weight on file to calculate BMI. Constitutional Well developed. Well nourished.  Vascular Dorsalis pedis pulses palpable bilaterally. Posterior tibial pulses palpable bilaterally.  Capillary refill normal to all digits.  No cyanosis or clubbing noted. Pedal hair growth normal.  Neurologic Normal speech. Oriented to person, place, and time. Epicritic sensation to light touch grossly present bilaterally.  Dermatologic Nails well groomed and normal in appearance. No open wounds. No skin lesions.  Orthopedic:  Pain on palpation to the medial side of the ankle.  Pain with resisted eversion/inversion.  Pain along the course of the posterior tibial tendon at its insertion.   Radiographs: 3 views of skeletally mature adult foot: There is a decreasing calcaneal inclination angle increase in talar declination angle increase in Mary's angle mild dorsal elevatus of the first ray noted.  No other bony deformities noted  no other arthritic changes noted. Assessment:   1. Right ankle pain, unspecified chronicity   2. Posterior tibial tendinitis of right leg    Plan:  Patient was evaluated and treated and all questions answered.  Right posterior tibial tendinitis -I explained to the patient the etiology of posterior tibial tendinitis with this association with pes planus deformity.  Given that her pain is mild to moderate in nature and well-controlled I believe patient will benefit from orthotics management.  Patient's help already.  Orthotics made by her previous physician.  Have instructed her to place the orthotics in the shoe.  She states that she would do so right away.  I also believe the patient that she is having she will benefit from steroid injection within the tendon itself but surrounding the tendon to decrease inflammation Due to high risk of rupture.  Patient agrees with the plan and would like to proceed with injection. -A steroid injection was performed at right medial foot using 1% plain Lidocaine and 10 mg of Kenalog. This was well tolerated. -If her pain does not resolve today to help place her in a cam boot next time.  I will also consider obtaining an MRI during next visit if her pain does not improve.  No follow-ups on file.

## 2019-08-28 ENCOUNTER — Ambulatory Visit: Payer: Self-pay | Admitting: Internal Medicine

## 2019-08-28 LAB — QUANTIFERON-TB GOLD PLUS
QuantiFERON Mitogen Value: >10 IU/mL
QuantiFERON Nil Value: 0.01 IU/mL
QuantiFERON TB1 Ag Value: 0.02 IU/mL
QuantiFERON TB2 Ag Value: 0.02 IU/mL
QuantiFERON-TB Gold Plus: NEGATIVE

## 2019-08-28 LAB — HEPATITIS B SURFACE ANTIBODY,QUALITATIVE: Hep B Surface Ab, Qual: NONREACTIVE

## 2019-09-01 NOTE — Progress Notes (Addendum)
This visit occurred during the SARS-CoV-2 public health emergency.  Safety protocols were in place, including screening questions prior to the visit, additional usage of staff PPE, and extensive cleaning of exam room while observing appropriate contact time as indicated for disinfecting solutions.  Subjective:     Patient ID: Loretta Norris , female    DOB: 06/10/1971 , 48 y.o.   MRN: [id number]   Chief Complaint  Patient presents with  . Chantix f/u    HPI  She is here today for Chantix f/u. Unfortunately, she has yet to start the medication. She has since weaned herself off of Bupropion XL since her last visit.     Past Medical History:  Diagnosis Date  . Hypertension      Family History  Problem Relation Age of Onset  . Asthma Mother   . Hypertension Mother      Current Outpatient Medications:  .  Cyanocobalamin (VITAMIN B-12 PO), Take 1 tablet by mouth daily., Disp: , Rfl:  .  fexofenadine (ALLEGRA) 180 MG tablet, Take 1 tablet (180 mg total) by mouth daily., Disp: 30 tablet, Rfl: 2 .  ibuprofen (ADVIL,MOTRIN) 200 MG tablet, Take 600 mg by mouth every 8 (eight) hours as needed for cramping (menstrual cramping)., Disp: , Rfl:  .  OVER THE COUNTER MEDICATION, Take 8.6 mg by mouth daily. Vegetable Laxative, Disp: , Rfl:  .  telmisartan-hydrochlorothiazide (MICARDIS HCT) 80-25 MG tablet, Take 1 tablet by mouth daily., Disp: 90 tablet, Rfl: 2 .  triamcinolone cream (KENALOG) 0.1 %, APPLY TO AFFECTED AREA TWICE A DAY AS NEEDED., Disp: 30 g, Rfl: 2 .  valACYclovir (VALTREX) 500 MG tablet, Take 500 mg by mouth daily as needed (prevent flare ups). , Disp: , Rfl:  .  buPROPion (WELLBUTRIN XL) 150 MG 24 hr tablet, , Disp: , Rfl:  .  Ginger, Zingiber officinalis, (GINGER PO), Take 1 capsule by mouth daily., Disp: , Rfl:  .  HYDROcodone-acetaminophen (NORCO/VICODIN) 5-325 MG per tablet, Take 1 tablet by mouth every 6 (six) hours as needed., Disp: 30 tablet, Rfl: 0 .  varenicline  (CHANTIX STARTING MONTH PAK) 0.5 MG X 11 & 1 MG X 42 tablet, Take one 0.5 mg tablet by mouth once daily for 3 days, then increase to one 0.5 mg tablet twice daily for 4 days, then increase to one 1 mg tablet twice daily., Disp: 53 tablet, Rfl: 0   Allergies  Allergen Reactions  . Allevess [Capsaicin-Menthol] Hives and Nausea And Vomiting  . Naproxen Sodium Hives and Itching    Can tolerate Ibuprofen OKI     Review of Systems  Constitutional: Negative.   Respiratory: Negative.   Cardiovascular: Negative.   Gastrointestinal: Negative.   Neurological: Negative.   Psychiatric/Behavioral: Negative.      Today's Vitals   08/22/19 1645  BP: 124/80  Pulse: (!) 57  Temp: 98.1 F (36.7 C)  TempSrc: Oral  Weight: 197 lb 3.2 oz (89.4 kg)  Height: 5' 1.8" (1.57 m)   Body mass index is 36.3 kg/m.   Objective:  Physical Exam Vitals and nursing note reviewed.  Constitutional:      Appearance: Normal appearance.  HENT:     Head: Normocephalic and atraumatic.  Cardiovascular:     Rate and Rhythm: Normal rate and regular rhythm.     Heart sounds: Normal heart sounds.  Pulmonary:     Effort: Pulmonary effort is normal.     Breath sounds: Normal breath sounds.  Skin:    General:  Skin is warm.  Neurological:     General: No focal deficit present.     Mental Status: She is alert.  Psychiatric:        Mood and Affect: Mood normal.        Behavior: Behavior normal.      Hearing Screening   Method: Audiometry   125Hz  250Hz  500Hz  1000Hz  2000Hz  3000Hz  4000Hz  6000Hz  8000Hz   Right ear:  25 25 25 25  25     Left ear:  25 25 25 25  25       Visual Acuity Screening   Right eye Left eye Both eyes  Without correction: 20/30 20/30 20/20   With correction: 20/20 20/20 20/15         Assessment And Plan:     1. Cigarette nicotine dependence without complication  Chronic. She was given rx Chantix starter pack.  We discussed possible side effects including nausea and mood changes. She is  advised to take meds as directed and to have her family/friends look out for mood changes.  She is encouraged to pick a quit date in the near future. She will rto in 8 weeks for re-evaluation.   2. Essential hypertension, benign  Chronic, well controlled.  She will continue with current meds for now.   - POCT Urinalysis Dipstick (81002)  3. Encounter for screening  Hearing and vision screening performed today. No further testing is needed at this time. I will also check labs as listed below. She has 20/15 visit with corrective lenses and she passed her audiometry test.   - Hepatitis B Surface Antibody - QuantiFERON-TB Gold Plus  4. Encounter for completion of form with patient  I also met with patient to discuss information needed for her form. She agrees to hearing/vision testing and u/a and bloodwork as needed. She was given one form back to complete in its entirety. I will sign off on it once completed.   5. Tobacco abuse counseling  Smoking cessation instruction/counseling given:  counseled patient on the dangers of tobacco use, advised patient to stop smoking, and reviewed strategies to maximize success    Maximino Greenland, MD    THE PATIENT IS ENCOURAGED TO PRACTICE SOCIAL DISTANCING DUE TO THE COVID-19 PANDEMIC.

## 2019-09-09 ENCOUNTER — Encounter: Payer: Self-pay | Admitting: Internal Medicine

## 2019-09-09 ENCOUNTER — Telehealth: Payer: Self-pay

## 2019-09-09 NOTE — Telephone Encounter (Signed)
The pt was notified that her paperwork is ready for pickup.

## 2019-09-10 ENCOUNTER — Telehealth: Payer: Self-pay

## 2019-09-10 NOTE — Telephone Encounter (Signed)
The pt was notified that her paperwork needed her signature on it and that the office will  Make a copy of her paperwork once she signs it.

## 2019-09-13 ENCOUNTER — Encounter: Payer: Self-pay | Admitting: Podiatry

## 2019-09-13 ENCOUNTER — Other Ambulatory Visit: Payer: Self-pay

## 2019-09-13 ENCOUNTER — Ambulatory Visit: Payer: BC Managed Care – PPO | Admitting: Podiatry

## 2019-09-13 DIAGNOSIS — M76821 Posterior tibial tendinitis, right leg: Secondary | ICD-10-CM

## 2019-09-13 DIAGNOSIS — M25571 Pain in right ankle and joints of right foot: Secondary | ICD-10-CM

## 2019-09-13 DIAGNOSIS — M2142 Flat foot [pes planus] (acquired), left foot: Secondary | ICD-10-CM | POA: Diagnosis not present

## 2019-09-13 DIAGNOSIS — M2141 Flat foot [pes planus] (acquired), right foot: Secondary | ICD-10-CM | POA: Diagnosis not present

## 2019-09-13 NOTE — Progress Notes (Signed)
Subjective:  Patient ID: Loretta Norris, female    DOB: 1971/08/23,  MRN: [id number]  Chief Complaint  Patient presents with  . Follow-up    R Ankle - swelling. Pt stated, "No swelling now. It doesn't really hurt. I had some pain last night after a [employer] step class".  . Foot Orthotics    Requesting orthotics. Pt stated, "I have orthotics already, but I plan to join the [employer] on [id number]. I would like to have a pair that I can change between my boots and tennis shoes".    49 y.o. female presents with the above complaint.  Patient states that she is doing much better.  She states that it has not really been swollen anymore.  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  She otherwise it does not hurt.  She states the injection from last time has helped.  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  She denies any other acute complaints.  She did not bring her orthotics today.  She ambulates in regular sneakers today.   Review of Systems: Negative except as noted in the HPI. Denies N/V/F/Ch.  Past Medical History:  Diagnosis Date  . Hypertension     Current Outpatient Medications:  .  buPROPion (WELLBUTRIN XL) 150 MG 24 hr tablet, , Disp: , Rfl:  .  Cyanocobalamin (VITAMIN B-12 PO), Take 1 tablet by mouth daily., Disp: , Rfl:  .  fexofenadine (ALLEGRA) 180 MG tablet, Take 1 tablet (180 mg total) by mouth daily., Disp: 30 tablet, Rfl: 2 .  Ginger, Zingiber officinalis, (GINGER PO), Take 1 capsule by mouth daily., Disp: , Rfl:  .  HYDROcodone-acetaminophen (NORCO/VICODIN) 5-325 MG per tablet, Take 1 tablet by mouth every 6 (six) hours as needed., Disp: 30 tablet, Rfl: 0 .  ibuprofen (ADVIL,MOTRIN) 200 MG tablet, Take 600 mg by mouth every 8 (eight) hours as needed for cramping (menstrual cramping)., Disp: , Rfl:  .  OVER THE COUNTER MEDICATION, Take 8.6  mg by mouth daily. Vegetable Laxative, Disp: , Rfl:  .  telmisartan-hydrochlorothiazide (MICARDIS HCT) 80-25 MG tablet, Take 1 tablet by mouth daily., Disp: 90 tablet, Rfl: 2 .  triamcinolone cream (KENALOG) 0.1 %, APPLY TO AFFECTED AREA TWICE A DAY AS NEEDED., Disp: 30 g, Rfl: 2 .  valACYclovir (VALTREX) 500 MG tablet, Take 500 mg by mouth daily as needed (prevent flare ups). , Disp: , Rfl:  .  varenicline (CHANTIX STARTING MONTH PAK) 0.5 MG X 11 & 1 MG X 42 tablet, Take one 0.5 mg tablet by mouth once daily for 3 days, then increase to one 0.5 mg tablet twice daily for 4 days, then increase to one 1 mg tablet twice daily., Disp: 53 tablet, Rfl: 0  Social History   Tobacco Use  Smoking Status Current Every Day Smoker  . Packs/day: 0.25  . Years: 15.00  . Pack years: 3.75  . Types: Cigarettes  Smokeless Tobacco Never Used    Allergies  Allergen Reactions  . Allevess [Capsaicin-Menthol] Hives and Nausea And Vomiting  . Naproxen Sodium Hives and Itching    Can tolerate Ibuprofen OKI   Objective:   There were no vitals filed for this visit. There is no height or weight on file to calculate BMI. Constitutional Well developed. Well nourished.  Vascular Dorsalis pedis pulses palpable bilaterally. Posterior tibial pulses  palpable bilaterally. Capillary refill normal to all digits.  No cyanosis or clubbing noted. Pedal hair growth normal.  Neurologic Normal speech. Oriented to person, place, and time. Epicritic sensation to light touch grossly present bilaterally.  Dermatologic Nails well groomed and normal in appearance. No open wounds. No skin lesions.  Orthopedic:  No pain on palpation to the medial side of the ankle.  No pain with resisted eversion/inversion.  No pain along the course of the posterior tibial tendon at its insertion.   Radiographs: None Assessment:   No diagnosis found. Plan:  Patient was evaluated and treated and all questions answered.  Right posterior  tibial tendinitis -It appears that patient has clinically improved in terms of her pain.  I explained to her that even though the pain has been clinically improved with injection it may come back given the nature of her foot.  Patient presents with pes planus semiflexible foot type which is putting aggressive stress on the right posterior tibial tendon.  And I explained to her that if her arches are not supported that the posterior tibial tendinitis could come back.  Patient states understanding.  Semiflexible pes planus deformity -I explained to the patient the etiology of pes planus deformity in conjunction with posterior tibial tendinitis and I believe patient will benefit from custom-made orthotics to help support the arches of her foot as well as control the hindfoot motion. -Patient will be seen by Liliane Channel today to have orthotics made.  No follow-ups on file.

## 2019-09-23 ENCOUNTER — Encounter: Payer: Self-pay | Admitting: Internal Medicine

## 2019-09-27 ENCOUNTER — Encounter: Payer: Self-pay | Admitting: Internal Medicine

## 2019-10-01 ENCOUNTER — Other Ambulatory Visit: Payer: Self-pay

## 2019-10-01 ENCOUNTER — Ambulatory Visit: Payer: BC Managed Care – PPO | Admitting: Orthotics

## 2019-10-01 DIAGNOSIS — M76821 Posterior tibial tendinitis, right leg: Secondary | ICD-10-CM

## 2019-10-01 DIAGNOSIS — M25571 Pain in right ankle and joints of right foot: Secondary | ICD-10-CM

## 2019-10-01 NOTE — Progress Notes (Signed)
Patient came in today to pick up custom made foot orthotics.  The goals were accomplished and the patient reported no dissatisfaction with said orthotics.  Patient was advised of breakin period and how to report any issues. 

## 2019-10-03 ENCOUNTER — Ambulatory Visit: Payer: BC Managed Care – PPO | Admitting: Internal Medicine

## 2019-10-10 ENCOUNTER — Encounter: Payer: Self-pay | Admitting: Internal Medicine

## 2019-10-10 ENCOUNTER — Ambulatory Visit: Payer: BC Managed Care – PPO | Admitting: Internal Medicine

## 2019-10-10 ENCOUNTER — Other Ambulatory Visit: Payer: Self-pay

## 2019-10-10 VITALS — BP 130/80 | HR 95 | Temp 97.6°F | Ht 61.8 in | Wt 205.6 lb

## 2019-10-10 DIAGNOSIS — Z716 Tobacco abuse counseling: Secondary | ICD-10-CM | POA: Diagnosis not present

## 2019-10-10 DIAGNOSIS — I10 Essential (primary) hypertension: Secondary | ICD-10-CM

## 2019-10-10 DIAGNOSIS — Z6837 Body mass index (BMI) 37.0-37.9, adult: Secondary | ICD-10-CM

## 2019-10-10 DIAGNOSIS — F17210 Nicotine dependence, cigarettes, uncomplicated: Secondary | ICD-10-CM

## 2019-10-10 MED ORDER — CONTRAVE 8-90 MG PO TB12: ORAL_TABLET | ORAL | 0 refills | Status: DC

## 2019-10-10 NOTE — Patient Instructions (Signed)
Bupropion; Naltrexone extended-release tablets What is this medicine? BUPROPION; NALTREXONE (byoo PROE pee on; nal TREX one) is a combination of two drugs that help you lose weight. This product is used with a reduced calorie diet and exercise. This product can also help you maintain weight loss. This medicine may be used for other purposes; ask your health care provider or pharmacist if you have questions. COMMON BRAND NAME(S): Contrave What should I tell my health care provider before I take this medicine? They need to know if you have any of these conditions:  an eating disorder, such as anorexia or bulimia  diabetes  depression  glaucoma  head injury  heart disease  high blood pressure  history of drug abuse or alcohol abuse problem  history of a tumor or infection of your brain or spine  history of heart attack or stroke  history of irregular heartbeat  if you often drink alcohol  kidney disease  liver disease  low levels of sodium in the blood  mental illness  seizures  suicidal thoughts, plans, or attempt; a previous suicide attempt by you or a family member  taken an MAOI like Carbex, Eldepryl, Marplan, Nardil, or Parnate in last 14 days  an unusual or allergic reaction to bupropion, naltrexone, other medicines, foods, dyes, or preservatives  breast-feeding  pregnant or trying to become pregnant How should I use this medicine? Take this medicine by mouth with a glass of water. Follow the directions on the prescription label. Do not cut, crush or chew this medicine. Swallow the tablets whole. You can take it with or without food. Do not take with high-fat meals as this may increase your risk of seizures. Take your medicine at regular intervals. Do not take it more often than directed. Do not stop taking except on your doctor's advice. A special MedGuide will be given to you by the pharmacist with each prescription and refill. Be sure to read this  information carefully each time. Talk to your pediatrician regarding the use of this medicine in children. Special care may be needed. Overdosage: If you think you have taken too much of this medicine contact a poison control center or emergency room at once. NOTE: This medicine is only for you. Do not share this medicine with others. What if I miss a dose? If you miss a dose, skip it. Take your next dose at the normal time. Do not take extra or 2 doses at the same time to make up for the missed dose. What may interact with this medicine? Do not take this medicine with any of the following medications:  any medicines used to stop taking opioids such as methadone or buprenorphine  linezolid  MAOIs like Carbex, Eldepryl, Marplan, Nardil, and Parnate  methylene blue (injected into a vein)  often take narcotic medicines for pain or cough  other medicines that contain bupropion like Zyban or Wellbutrin This medicine may also interact with the following medications:  alcohol  certain medicines for blood pressure like metoprolol, propranolol  certain medicines for depression, anxiety, or psychotic disturbances  certain medicines for HIV or hepatitis  certain medicines for irregular heart beat like propafenone, flecainide  certain medicines for Parkinson's disease like amantadine, levodopa  certain medicines for seizures like carbamazepine, phenytoin, phenobarbital  certain medicines for sleep  cimetidine  clopidogrel  cyclophosphamide  digoxin  disulfiram  furazolidone  isoniazid  nicotine  orphenadrine  procarbazine  steroid medicines like prednisone or cortisone  stimulant medicines for attention disorders,   weight loss, or to stay awake  tamoxifen  theophylline  thiotepa  ticlopidine  tramadol  warfarin This list may not describe all possible interactions. Give your health care provider a list of all the medicines, herbs, non-prescription drugs, or  dietary supplements you use. Also tell them if you smoke, drink alcohol, or use illegal drugs. Some items may interact with your medicine. What should I watch for while using this medicine? Visit your doctor or healthcare provider for regular checks on your progress. This medicine may cause serious skin reactions. They can happen weeks to months after starting the medicine. Contact your healthcare provider right away if you notice fevers or flu-like symptoms with a rash. The rash may be red or purple and then turn into blisters or peeling of the skin. Or, you might notice a red rash with swelling of the face, lips or lymph nodes in your neck or under your arms. This medicine may affect blood sugar. Ask your healthcare provider if changes in diet or medicines are needed if you have diabetes. Patients and their families should watch out for new or worsening depression or thoughts of suicide. Also watch out for sudden changes in feelings such as feeling anxious, agitated, panicky, irritable, hostile, aggressive, impulsive, severely restless, overly excited and hyperactive, or not being able to sleep. If this happens, especially at the beginning of treatment or after a change in dose, call your healthcare provider. Avoid alcoholic drinks while taking this medicine. Drinking large amounts of alcoholic beverages, using sleeping or anxiety medicines, or quickly stopping the use of these agents while taking this medicine may increase your risk for a seizure. Do not drive or use heavy machinery until you know how this medicine affects you. This medicine can impair your ability to perform these tasks. Women should inform their health care provider if they wish to become pregnant or think they might be pregnant. Losing weight while pregnant is not advised and may cause harm to the unborn child. Talk to your health care provider for more information. What side effects may I notice from receiving this medicine? Side  effects that you should report to your doctor or health care professional as soon as possible:  allergic reactions like skin rash, itching or hives, swelling of the face, lips, or tongue  breathing problems  changes in vision  confusion  elevated mood, decreased need for sleep, racing thoughts, impulsive behavior  fast or irregular heartbeat  hallucinations, loss of contact with reality  increased blood pressure  rash, fever, and swollen lymph nodes  redness, blistering, peeling, or loosening of the skin, including inside the mouth  seizures  signs and symptoms of liver injury like dark yellow or brown urine; general ill feeling or flu-like symptoms; light-colored stools; loss of appetite; nausea; right upper belly pain; unusually weak or tired; yellowing of the eyes or skin  suicidal thoughts or other mood changes  vomiting Side effects that usually do not require medical attention (report to your doctor or health care professional if they continue or are bothersome):  constipation  headache  loss of appetite  indigestion, stomach upset  tremors This list may not describe all possible side effects. Call your doctor for medical advice about side effects. You may report side effects to FDA at 1-800-FDA-1088. Where should I keep my medicine? Keep out of the reach of children. Store at room temperature between 15 and 30 degrees C (59 and 86 degrees F). Throw away any unused medicine after the  expiration date. NOTE: This sheet is a summary. It may not cover all possible information. If you have questions about this medicine, talk to your doctor, pharmacist, or health care provider.  2020 Elsevier/Gold Standard (2019-06-28 [id number])

## 2019-10-10 NOTE — Progress Notes (Signed)
This visit occurred during the SARS-CoV-2 public health emergency.  Safety protocols were in place, including screening questions prior to the visit, additional usage of staff PPE, and extensive cleaning of exam room while observing appropriate contact time as indicated for disinfecting solutions.  Subjective:     Patient ID: Loretta Norris , female    DOB: [birth year] , 49 y.o.   MRN: [id number]   Chief Complaint  Patient presents with  . Nicotine Dependence    HPI  She is here today for f/u nicotine dependence. She was started on Chantix at her last visit. She reports that she stopped it about three weeks ago. She reports having bad nightmares while on the medication. She did not wish to resume the meds.   Nicotine Dependence Presents for follow-up visit. Her urge triggers include company of smokers. Her first smoke is from 8 to 10 AM. She smokes < 1/2 a pack of cigarettes per day.     Past Medical History:  Diagnosis Date  . Hypertension      Family History  Problem Relation Age of Onset  . Asthma Mother   . Hypertension Mother      Current Outpatient Medications:  .  buPROPion (WELLBUTRIN XL) 150 MG 24 hr tablet, , Disp: , Rfl:  .  Cyanocobalamin (VITAMIN B-12 PO), Take 1 tablet by mouth daily., Disp: , Rfl:  .  fexofenadine (ALLEGRA) 180 MG tablet, Take 1 tablet (180 mg total) by mouth daily., Disp: 30 tablet, Rfl: 2 .  Ginger, Zingiber officinalis, (GINGER PO), Take 1 capsule by mouth daily., Disp: , Rfl:  .  ibuprofen (ADVIL,MOTRIN) 200 MG tablet, Take 600 mg by mouth every 8 (eight) hours as needed for cramping (menstrual cramping)., Disp: , Rfl:  .  OVER THE COUNTER MEDICATION, Take 8.6 mg by mouth daily. Vegetable Laxative, Disp: , Rfl:  .  telmisartan-hydrochlorothiazide (MICARDIS HCT) 80-25 MG tablet, Take 1 tablet by mouth daily., Disp: 90 tablet, Rfl: 2 .  triamcinolone cream (KENALOG) 0.1 %, APPLY TO AFFECTED AREA TWICE A DAY AS NEEDED., Disp: 30 g, Rfl: 2 .   valACYclovir (VALTREX) 500 MG tablet, Take 500 mg by mouth daily as needed (prevent flare ups). , Disp: , Rfl:  .  varenicline (CHANTIX STARTING MONTH PAK) 0.5 MG X 11 & 1 MG X 42 tablet, Take one 0.5 mg tablet by mouth once daily for 3 days, then increase to one 0.5 mg tablet twice daily for 4 days, then increase to one 1 mg tablet twice daily., Disp: 53 tablet, Rfl: 0 .  Naltrexone-buPROPion HCl ER (CONTRAVE) 8-90 MG TB12, Start 1 tablet every morning for 7 days, then 1 tablet twice daily for 7 days, then 2 tablets every morning and one every evening, then 2 tabs twice daily, Disp: 70 tablet, Rfl: 0   Allergies  Allergen Reactions  . Allevess [Capsaicin-Menthol] Hives and Nausea And Vomiting  . Naproxen Sodium Hives and Itching    Can tolerate Ibuprofen OKI     Review of Systems  Constitutional: Negative.   Respiratory: Negative.   Cardiovascular: Negative.   Gastrointestinal: Negative.   Neurological: Negative.   Psychiatric/Behavioral: Negative.      Today's Vitals   10/10/19 1519  BP: 130/80  Pulse: 95  Temp: 97.6 F (36.4 C)  Weight: 205 lb 9.6 oz (93.3 kg)  Height: 5' 1.8" (1.57 m)   Body mass index is 37.85 kg/m.   Objective:  Physical Exam Vitals and nursing note reviewed.  Constitutional:  Appearance: Normal appearance. She is obese.  HENT:     Head: Normocephalic and atraumatic.  Cardiovascular:     Rate and Rhythm: Normal rate and regular rhythm.     Heart sounds: Normal heart sounds.  Pulmonary:     Effort: Pulmonary effort is normal.     Breath sounds: Normal breath sounds.  Skin:    General: Skin is warm.  Neurological:     General: No focal deficit present.     Mental Status: She is alert.  Psychiatric:        Mood and Affect: Mood normal.        Behavior: Behavior normal.         Assessment And Plan:     1. Cigarette nicotine dependence without complication  Unfortunately, she did not tolerate Chantix due to nightmares. She has been on  Wellbutrin in the past, but she felt this was ineffective. I will discuss other options at future visit.   2. Essential hypertension, benign  Chronic, controlled.  She will continue with current meds. She is encouraged to avoid adding salt to her foods.    3. Class 2 severe obesity due to excess calories with serious comorbidity in adult, BMI 37 (HCC)  We discussed the use of Contrave to help support her weight loss efforts. She was advised of possible side effects. She is also advised to avoid narcotics while on this medication. She is also advised to notify me prior to any planned procedures.  Dosing schedule was reviewed with the patient - one tab po qd x 7 days, then one tab po bid x 7 days, then 2 tabs po qam and one tab po pm x 7 days, and then 2 tabs twice daily. She will rto in four to six weeks for re-evaluation.   4. Tobacco abuse counseling  Smoking cessation instruction/counseling given:  counseled patient on the dangers of tobacco use, advised patient to stop smoking, and reviewed strategies to maximize success  Maximino Greenland, MD    THE PATIENT IS ENCOURAGED TO PRACTICE SOCIAL DISTANCING DUE TO THE COVID-19 PANDEMIC.

## 2019-10-15 ENCOUNTER — Telehealth: Payer: Self-pay

## 2019-10-15 NOTE — Telephone Encounter (Signed)
LEFT VM FOR PT TO RETURN CALL. NEED TO KNOW IF PT IS WILLING TO TAKE SAXENDA FOR WEIGHT LOSS. IT IS PREFERRED OVER CONTRAVE.

## 2019-10-16 ENCOUNTER — Telehealth: Payer: Self-pay

## 2019-10-16 NOTE — Telephone Encounter (Signed)
Pa approved for contrave. Cost to pt is $159 due to deductable. Pharm notified

## 2019-11-07 ENCOUNTER — Other Ambulatory Visit: Payer: Self-pay

## 2019-11-07 ENCOUNTER — Ambulatory Visit: Payer: BC Managed Care – PPO | Attending: Family

## 2019-11-07 DIAGNOSIS — Z23 Encounter for immunization: Secondary | ICD-10-CM

## 2019-11-07 NOTE — Progress Notes (Signed)
   Covid-19 Vaccination Clinic  Name:  Loretta Norris    MRN: [id number] DOB: 08/22/1971  11/07/2019  [name] was observed post Covid-19 immunization for 15 minutes without incident. She was provided with Vaccine Information Sheet and instruction to access the V-Safe system.   Ms. Daily was instructed to call 911 with any severe reactions post vaccine: Marland Kitchen Difficulty breathing  . Swelling of face and throat  . A fast heartbeat  . A bad rash all over body  . Dizziness and weakness   Immunizations Administered    Name Date Dose VIS Date Route   Moderna COVID-19 Vaccine 11/07/2019  4:46 PM 0.5 mL 08/06/2019 Intramuscular   Manufacturer: Moderna   Lot: [id number]   [id number]

## 2019-11-14 LAB — HM PAP SMEAR

## 2019-12-06 ENCOUNTER — Encounter: Payer: Self-pay | Admitting: Internal Medicine

## 2019-12-09 ENCOUNTER — Encounter: Payer: BC Managed Care – PPO | Admitting: Internal Medicine

## 2019-12-10 ENCOUNTER — Ambulatory Visit: Payer: BC Managed Care – PPO

## 2019-12-17 ENCOUNTER — Ambulatory Visit: Payer: BC Managed Care – PPO | Attending: Family

## 2019-12-17 DIAGNOSIS — Z23 Encounter for immunization: Secondary | ICD-10-CM

## 2019-12-17 NOTE — Progress Notes (Signed)
   Covid-19 Vaccination Clinic  Name:  Loretta Norris    MRN: [id number] DOB: 07/17/1971  12/17/2019  [name] was observed post Covid-19 immunization for 15 minutes without incident. She was provided with Vaccine Information Sheet and instruction to access the V-Safe system.   [name] was instructed to call 911 with any severe reactions post vaccine: Marland Kitchen Difficulty breathing  . Swelling of face and throat  . A fast heartbeat  . A bad rash all over body  . Dizziness and weakness   Immunizations Administered    Name Date Dose VIS Date Route   Moderna COVID-19 Vaccine 12/17/2019 12:09 PM 0.5 mL 08/06/2019 Intramuscular   Manufacturer: Moderna   Lot: [id number]   [id number]

## 2019-12-24 ENCOUNTER — Encounter: Payer: Self-pay | Admitting: Internal Medicine

## 2019-12-25 ENCOUNTER — Other Ambulatory Visit: Payer: Self-pay

## 2019-12-25 MED ORDER — FEXOFENADINE HCL 180 MG PO TABS: 180.0000 mg | ORAL_TABLET | Freq: Every day | ORAL | 2 refills | Status: DC

## 2020-02-04 ENCOUNTER — Telehealth: Payer: Self-pay

## 2020-02-04 NOTE — Telephone Encounter (Signed)
Left vm for pt to call back for appointment

## 2020-02-05 ENCOUNTER — Other Ambulatory Visit: Payer: Self-pay

## 2020-02-05 ENCOUNTER — Encounter: Payer: Self-pay | Admitting: Nurse Practitioner

## 2020-02-05 ENCOUNTER — Ambulatory Visit: Payer: BC Managed Care – PPO | Admitting: Nurse Practitioner

## 2020-02-05 VITALS — BP 118/76 | HR 67 | Temp 98.5°F | Ht 59.4 in | Wt 195.0 lb

## 2020-02-05 DIAGNOSIS — M25512 Pain in left shoulder: Secondary | ICD-10-CM

## 2020-02-05 MED ORDER — PREDNISONE 20 MG PO TABS: ORAL_TABLET | ORAL | 0 refills | Status: DC

## 2020-02-05 MED ORDER — TRIAMCINOLONE ACETONIDE 40 MG/ML IJ SUSP
40.0000 mg | Freq: Once | INTRAMUSCULAR | Status: AC
  Administered 2020-02-05: 40 mg via INTRAMUSCULAR

## 2020-02-05 NOTE — Progress Notes (Signed)
This visit occurred during the SARS-CoV-2 public health emergency.  Safety protocols were in place, including screening questions prior to the visit, additional usage of staff PPE, and extensive cleaning of exam room while observing appropriate contact time as indicated for disinfecting solutions.  Subjective:     Patient ID: Loretta Norris , female    DOB: 04-24-1971 , 49 y.o.   MRN: [id number]   Chief Complaint  Patient presents with  . Arm Pain    patient stated  her arm has been hurting for the 3 weeks. 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    Shoulder Pain  The pain is present in the left shoulder. This is a new problem. The current episode started 1 to 4 weeks ago (3 weeks ago). There has been no history of extremity trauma. The problem occurs constantly (worse when trying to do different activities.). The problem has been unchanged. Quality: feels like a numbness and aching.   The patient is experiencing no pain. Associated symptoms include numbness (intermittent to left shoulder). Pertinent negatives include no fever or tingling. The symptoms are aggravated by activity and lying down. She has tried NSAIDS for the symptoms. Family history does not include gout. There is no history of osteoarthritis.     Past Medical History:  Diagnosis Date  . Hypertension      Family History  Problem Relation Age of Onset  . Asthma Mother   . Hypertension Mother      Current Outpatient Medications:  .  buPROPion (WELLBUTRIN XL) 150 MG 24 hr tablet, , Disp: , Rfl:  .  Cyanocobalamin (VITAMIN B-12 PO), Take 1 tablet by mouth daily., Disp: , Rfl:  .  fexofenadine (ALLEGRA) 180 MG tablet, Take 1 tablet (180 mg total) by mouth daily., Disp: 30 tablet, Rfl: 2 .  Ginger, Zingiber officinalis, (GINGER PO), Take 1 capsule by mouth daily., Disp: , Rfl:  .  ibuprofen  (ADVIL,MOTRIN) 200 MG tablet, Take 600 mg by mouth every 8 (eight) hours as needed for cramping (menstrual cramping)., Disp: , Rfl:  .  OVER THE COUNTER MEDICATION, Take 8.6 mg by mouth daily. Vegetable Laxative, Disp: , Rfl:  .  telmisartan-hydrochlorothiazide (MICARDIS HCT) 80-25 MG tablet, Take 1 tablet by mouth daily., Disp: 90 tablet, Rfl: 2 .  triamcinolone cream (KENALOG) 0.1 %, APPLY TO AFFECTED AREA TWICE A DAY AS NEEDED., Disp: 30 g, Rfl: 2 .  valACYclovir (VALTREX) 500 MG tablet, Take 500 mg by mouth daily as needed (prevent flare ups). , Disp: , Rfl:  .  Naltrexone-buPROPion HCl ER (CONTRAVE) 8-90 MG TB12, Start 1 tablet every morning for 7 days, then 1 tablet twice daily for 7 days, then 2 tablets every morning and one every evening, then 2 tabs twice daily (Patient not taking: Reported on 02/05/2020), Disp: 70 tablet, Rfl: 0 .  varenicline (CHANTIX STARTING MONTH PAK) 0.5 MG X 11 & 1 MG X 42 tablet, Take one 0.5 mg tablet by mouth once daily for 3 days, then increase to one 0.5 mg tablet twice daily for 4 days, then increase to one 1 mg tablet twice daily. (Patient not taking: Reported on 02/05/2020), Disp: 53 tablet, Rfl: 0   Allergies  Allergen Reactions  . Allevess [Capsaicin-Menthol] Hives and Nausea And Vomiting  . Naproxen Sodium Hives and Itching    Can tolerate  Ibuprofen OKI     Review of Systems  Constitutional: Negative for fatigue and fever.  Musculoskeletal:       Left shoulder pain   Neurological: Positive for numbness (intermittent to left shoulder). Negative for tingling.  Psychiatric/Behavioral: Negative.      Today's Vitals   02/05/20 0939  BP: 118/76  Pulse: 67  Temp: 98.5 F (36.9 C)  TempSrc: Oral  Weight: 195 lb (88.5 kg)  Height: 4' 11.4" (1.509 m)  PainSc: 5    Body mass index is 38.86 kg/m.   Objective:  Physical Exam Constitutional:      General: She is not in acute distress.    Appearance: Normal appearance.  Cardiovascular:     Rate and  Rhythm: Normal rate and regular rhythm.     Pulses: Normal pulses.     Heart sounds: Normal heart sounds.  Pulmonary:     Effort: Pulmonary effort is normal. No respiratory distress.     Breath sounds: Normal breath sounds.  Musculoskeletal:        General: No swelling or signs of injury.     Comments: Left shoulder with slight discomfort with extension.  Negative neer's and hawkins test  Skin:    Capillary Refill: Capillary refill takes less than 2 seconds.  Neurological:     General: No focal deficit present.     Mental Status: She is alert and oriented to person, place, and time.  Psychiatric:        Mood and Affect: Mood normal.        Behavior: Behavior normal.        Thought Content: Thought content normal.        Judgment: Judgment normal.         Assessment And Plan:     1. Acute pain of left shoulder  Will treat with steroid injection and oral steroid  Rest shoulder for 5 days  Bursitis vs osteoarthritis vs tendinitis - DG Shoulder Left; Future - triamcinolone acetonide (KENALOG-40) injection 40 mg - predniSONE (DELTASONE) 20 MG tablet; Take 2 tablets by daily x 3 days  Dispense: 6 tablet; Refill: 0   Minette Brine, FNP    THE PATIENT IS ENCOURAGED TO PRACTICE SOCIAL DISTANCING DUE TO THE COVID-19 PANDEMIC.

## 2020-02-06 ENCOUNTER — Other Ambulatory Visit: Payer: Self-pay

## 2020-02-06 ENCOUNTER — Ambulatory Visit
Admission: RE | Admit: 2020-02-06 | Discharge: 2020-02-06 | Disposition: A | Discharge status: Home / Self Care | Payer: BC Managed Care – PPO | Source: Ambulatory Visit | Attending: Nurse Practitioner | Admitting: Nurse Practitioner

## 2020-02-06 DIAGNOSIS — M25512 Pain in left shoulder: Secondary | ICD-10-CM

## 2020-02-06 MED ORDER — FEXOFENADINE HCL 180 MG PO TABS: 180.0000 mg | ORAL_TABLET | Freq: Every day | ORAL | 2 refills | Status: DC

## 2020-02-15 ENCOUNTER — Encounter: Payer: Self-pay | Admitting: Nurse Practitioner

## 2020-02-20 ENCOUNTER — Other Ambulatory Visit: Payer: Self-pay

## 2020-02-20 MED ORDER — CYCLOBENZAPRINE HCL 10 MG PO TABS: ORAL_TABLET | ORAL | 0 refills | Status: DC

## 2020-03-18 ENCOUNTER — Encounter: Payer: Self-pay | Admitting: Internal Medicine

## 2020-03-19 ENCOUNTER — Ambulatory Visit: Payer: BC Managed Care – PPO | Admitting: Internal Medicine

## 2020-03-23 ENCOUNTER — Ambulatory Visit: Payer: BC Managed Care – PPO | Admitting: Nurse Practitioner

## 2020-03-23 ENCOUNTER — Encounter: Payer: Self-pay | Admitting: Nurse Practitioner

## 2020-03-23 VITALS — BP 132/84 | HR 92 | Temp 98.9°F | Ht 61.6 in | Wt 190.6 lb

## 2020-03-23 DIAGNOSIS — L309 Dermatitis, unspecified: Secondary | ICD-10-CM

## 2020-03-23 MED ORDER — CLOBETASOL PROPIONATE 0.05 % EX OINT: 1.0000 "application " | TOPICAL_OINTMENT | Freq: Two times a day (BID) | CUTANEOUS | 3 refills | Status: DC

## 2020-03-23 MED ORDER — CLOBETASOL PROPIONATE 0.05 % EX OINT: 1.0000 "application " | TOPICAL_OINTMENT | Freq: Two times a day (BID) | CUTANEOUS | 0 refills | Status: DC

## 2020-03-23 NOTE — Progress Notes (Signed)
This visit occurred during the SARS-CoV-2 public health emergency.  Safety protocols were in place, including screening questions prior to the visit, additional usage of staff PPE, and extensive cleaning of exam room while observing appropriate contact time as indicated for disinfecting solutions.  Subjective:     Patient ID: Loretta Norris , female    DOB: Dec 24, 1970 , 49 y.o.   MRN: [id number]   Chief Complaint  Patient presents with  . ezcema    patient stated the medicine that she was using it not working for her she is currently using the betamethasone dipropionate     HPI  She is here today with worsening eczema to her bilateral arms, hands, and bilateral legs.  She was having increased itching which has gotten better.  She has been using clobetsone over the weekend.     Past Medical History:  Diagnosis Date  . Hypertension      Family History  Problem Relation Age of Onset  . Asthma Mother   . Hypertension Mother      Current Outpatient Medications:  .  Cyanocobalamin (VITAMIN B-12 PO), Take 1 tablet by mouth daily., Disp: , Rfl:  .  cyclobenzaprine (FLEXERIL) 10 MG tablet, One tablet by mouth at bedtime as needed for pain, Disp: 30 tablet, Rfl: 0 .  fexofenadine (ALLEGRA) 180 MG tablet, Take 1 tablet (180 mg total) by mouth daily., Disp: 30 tablet, Rfl: 2 .  Ginger, Zingiber officinalis, (GINGER PO), Take 1 capsule by mouth daily., Disp: , Rfl:  .  ibuprofen (ADVIL,MOTRIN) 200 MG tablet, Take 600 mg by mouth every 8 (eight) hours as needed for cramping (menstrual cramping)., Disp: , Rfl:  .  Naltrexone-buPROPion HCl ER (CONTRAVE) 8-90 MG TB12, Start 1 tablet every morning for 7 days, then 1 tablet twice daily for 7 days, then 2 tablets every morning and one every evening, then 2 tabs twice daily, Disp: 70 tablet, Rfl: 0 .  OVER THE COUNTER MEDICATION, Take 8.6 mg by mouth daily. Vegetable Laxative, Disp: , Rfl:  .  telmisartan-hydrochlorothiazide (MICARDIS HCT) 80-25 MG  tablet, Take 1 tablet by mouth daily., Disp: 90 tablet, Rfl: 2 .  valACYclovir (VALTREX) 500 MG tablet, Take 500 mg by mouth daily as needed (prevent flare ups). , Disp: , Rfl:  .  buPROPion (WELLBUTRIN XL) 150 MG 24 hr tablet, , Disp: , Rfl:  .  clobetasol ointment (TEMOVATE) 6.29 %, Apply 1 application topically 2 (two) times daily., Disp: 30 g, Rfl: 3   Allergies  Allergen Reactions  . Allevess [Capsaicin-Menthol] Hives and Nausea And Vomiting  . Naproxen Sodium Hives and Itching    Can tolerate Ibuprofen OKI     Review of Systems  Constitutional: Negative for fatigue.  Respiratory: Negative.   Cardiovascular: Negative.  Negative for chest pain, palpitations and leg swelling.  Skin: Positive for rash (bilateral arms, bilateral legs, and bilateral hands. ).  Neurological: Negative for dizziness and headaches.  Psychiatric/Behavioral: Negative.      Today's Vitals   03/23/20 1521  BP: 132/84  Pulse: 92  Temp: 98.9 F (37.2 C)  TempSrc: Oral  Weight: 190 lb 9.6 oz (86.5 kg)  Height: 5' 1.6" (1.565 m)  PainSc: 0-No pain   Body mass index is 35.32 kg/m.   Objective:  Physical Exam Constitutional:      General: She is not in acute distress.    Appearance: Normal appearance.  Cardiovascular:     Rate and Rhythm: Normal rate and regular rhythm.  Pulses: Normal pulses.     Heart sounds: Normal heart sounds. No murmur heard.   Pulmonary:     Effort: Pulmonary effort is normal. No respiratory distress.     Breath sounds: Normal breath sounds.  Neurological:     General: No focal deficit present.     Mental Status: She is alert and oriented to person, place, and time.  Psychiatric:        Mood and Affect: Mood normal.        Behavior: Behavior normal.        Thought Content: Thought content normal.        Judgment: Judgment normal.         Assessment And Plan:     1. Eczema, unspecified type  She has dry scaly skin to her hands bilaterally, also has patches  of dry areas   Encouraged to use lubriderm or eucerin cream or even a shea butter to provide more moisture to skin - clobetasol ointment (TEMOVATE) 0.05 %; Apply 1 application topically 2 (two) times daily.  Dispense: 30 g; Refill: 3     Patient was given opportunity to ask questions. Patient verbalized understanding of the plan and was able to repeat key elements of the plan. All questions were answered to their satisfaction.  Minette Brine, FNP   I, Minette Brine, FNP, have reviewed all documentation for this visit. The documentation on 03/23/20 for the exam, diagnosis, procedures, and orders are all accurate and complete.  THE PATIENT IS ENCOURAGED TO PRACTICE SOCIAL DISTANCING DUE TO THE COVID-19 PANDEMIC.

## 2020-03-23 NOTE — Patient Instructions (Addendum)

## 2020-04-21 ENCOUNTER — Other Ambulatory Visit: Payer: Self-pay

## 2020-04-21 ENCOUNTER — Encounter: Payer: Self-pay | Admitting: Internal Medicine

## 2020-04-21 ENCOUNTER — Ambulatory Visit (INDEPENDENT_AMBULATORY_CARE_PROVIDER_SITE_OTHER): Payer: BC Managed Care – PPO | Admitting: Internal Medicine

## 2020-04-21 VITALS — BP 118/74 | HR 79 | Temp 98.1°F | Ht 62.4 in | Wt 191.4 lb

## 2020-04-21 DIAGNOSIS — Z0000 Encounter for general adult medical examination without abnormal findings: Secondary | ICD-10-CM | POA: Diagnosis not present

## 2020-04-21 DIAGNOSIS — I10 Essential (primary) hypertension: Secondary | ICD-10-CM

## 2020-04-21 DIAGNOSIS — E6609 Other obesity due to excess calories: Secondary | ICD-10-CM | POA: Diagnosis not present

## 2020-04-21 DIAGNOSIS — Z6834 Body mass index (BMI) 34.0-34.9, adult: Secondary | ICD-10-CM

## 2020-04-21 DIAGNOSIS — Z23 Encounter for immunization: Secondary | ICD-10-CM

## 2020-04-21 DIAGNOSIS — Z1211 Encounter for screening for malignant neoplasm of colon: Secondary | ICD-10-CM

## 2020-04-21 LAB — POCT URINALYSIS DIPSTICK
Bilirubin, UA: NEGATIVE
Blood, UA: NEGATIVE
Glucose, UA: NEGATIVE
Ketones, UA: NEGATIVE
Leukocytes, UA: NEGATIVE
Nitrite, UA: NEGATIVE
Protein, UA: NEGATIVE
Spec Grav, UA: 1.01 (ref 1.010–1.025)
Urobilinogen, UA: 0.2 E.U./dL
pH, UA: 6 (ref 5.0–8.0)

## 2020-04-21 LAB — POCT UA - MICROALBUMIN
Albumin/Creatinine Ratio, Urine, POC: <30
Creatinine, POC: 100 mg/dL
Microalbumin Ur, POC: 10 mg/L

## 2020-04-21 NOTE — Progress Notes (Signed)
I,Katawbba Wiggins,acting as a Education administrator for Maximino Greenland, MD.,have documented all relevant documentation on the behalf of Maximino Greenland, MD,as directed by  Maximino Greenland, MD while in the presence of Maximino Greenland, MD.  This visit occurred during the SARS-CoV-2 public health emergency.  Safety protocols were in place, including screening questions prior to the visit, additional usage of staff PPE, and extensive cleaning of exam room while observing appropriate contact time as indicated for disinfecting solutions.  Subjective:     Patient ID: Loretta Norris , female    DOB: 1971-01-03 , 49 y.o.   MRN: [id number]   Chief Complaint  Patient presents with  . Annual Exam  . Hypertension    HPI  She is here today for a full physical examination. She is followed by Dr. Charlesetta Garibaldi for her GYN exams. She was last seen in Feb 2021.   Hypertension This is a chronic problem. The current episode started more than 1 year ago. The problem has been gradually improving since onset. The problem is controlled. Pertinent negatives include no blurred vision, chest pain, palpitations or shortness of breath.     Past Medical History:  Diagnosis Date  . Hypertension      Family History  Problem Relation Age of Onset  . Asthma Mother   . Hypertension Mother      Current Outpatient Medications:  .  clobetasol ointment (TEMOVATE) 2.03 %, Apply 1 application topically 2 (two) times daily., Disp: 30 g, Rfl: 3 .  Cyanocobalamin (VITAMIN B-12 PO), Take 1 tablet by mouth daily., Disp: , Rfl:  .  cyclobenzaprine (FLEXERIL) 10 MG tablet, One tablet by mouth at bedtime as needed for pain, Disp: 30 tablet, Rfl: 0 .  fexofenadine (ALLEGRA) 180 MG tablet, Take 1 tablet (180 mg total) by mouth daily., Disp: 30 tablet, Rfl: 2 .  Ginger, Zingiber officinalis, (GINGER PO), Take 1 capsule by mouth daily., Disp: , Rfl:  .  ibuprofen (ADVIL,MOTRIN) 200 MG tablet, Take 600 mg by mouth every 8 (eight) hours as needed  for cramping (menstrual cramping)., Disp: , Rfl:  .  Naltrexone-buPROPion HCl ER (CONTRAVE) 8-90 MG TB12, Start 1 tablet every morning for 7 days, then 1 tablet twice daily for 7 days, then 2 tablets every morning and one every evening, then 2 tabs twice daily, Disp: 70 tablet, Rfl: 0 .  OVER THE COUNTER MEDICATION, Take 8.6 mg by mouth daily. Vegetable Laxative, Disp: , Rfl:  .  telmisartan-hydrochlorothiazide (MICARDIS HCT) 80-25 MG tablet, Take 1 tablet by mouth daily., Disp: 90 tablet, Rfl: 2 .  valACYclovir (VALTREX) 500 MG tablet, Take 500 mg by mouth daily as needed (prevent flare ups). , Disp: , Rfl:  .  buPROPion (WELLBUTRIN XL) 150 MG 24 hr tablet, , Disp: , Rfl:    Allergies  Allergen Reactions  . Allevess [Capsaicin-Menthol] Hives and Nausea And Vomiting  . Naproxen Sodium Hives and Itching    Can tolerate Ibuprofen OKI      The patient states she uses tubal ligation for birth control. Last LMP was No LMP recorded (lmp unknown).. Negative for Dysmenorrhea. Negative for: breast discharge, breast lump(s), breast pain and breast self exam. Associated symptoms include abnormal vaginal bleeding. Pertinent negatives include abnormal bleeding (hematology), anxiety, decreased libido, depression, difficulty falling sleep, dyspareunia, history of infertility, nocturia, sexual dysfunction, sleep disturbances, urinary incontinence, urinary urgency, vaginal discharge and vaginal itching. Diet regular.The patient states her exercise level is  moderate.   . The patient's tobacco  use is:  Social History   Tobacco Use  Smoking Status Current Every Day Smoker  . Packs/day: 0.25  . Years: 15.00  . Pack years: 3.75  . Types: Cigarettes  Smokeless Tobacco Never Used  Tobacco Comment   try to cut back on number of cigs/day  . She has been exposed to passive smoke. The patient's alcohol use is:  Social History   Substance and Sexual Activity  Alcohol Use Yes   Comment: occasional    Review  of Systems  Constitutional: Negative.   HENT: Negative.   Eyes: Negative.  Negative for blurred vision.  Respiratory: Negative.  Negative for shortness of breath.   Cardiovascular: Negative.  Negative for chest pain and palpitations.  Gastrointestinal: Negative.   Endocrine: Negative.   Genitourinary: Negative.   Musculoskeletal: Negative.   Skin: Negative.   Allergic/Immunologic: Negative.   Neurological: Negative.   Hematological: Negative.   Psychiatric/Behavioral: Negative.      Today's Vitals   04/21/20 0940  BP: 118/74  Pulse: 79  Temp: 98.1 F (36.7 C)  TempSrc: Oral  Weight: 191 lb 6.4 oz (86.8 kg)  Height: 5' 2.4" (1.585 m)   Body mass index is 34.56 kg/m.  Wt Readings from Last 3 Encounters:  04/21/20 191 lb 6.4 oz (86.8 kg)  03/23/20 190 lb 9.6 oz (86.5 kg)  02/05/20 195 lb (88.5 kg)   Objective:  Physical Exam Constitutional:      General: She is not in acute distress.    Appearance: Normal appearance. She is well-developed. She is obese.  HENT:     Head: Normocephalic and atraumatic.     Right Ear: Hearing, tympanic membrane, ear canal and external ear normal. There is no impacted cerumen.     Left Ear: Hearing, tympanic membrane, ear canal and external ear normal. There is no impacted cerumen.     Nose:     Comments: Deferred, masked    Mouth/Throat:     Comments: Deferred, masked Eyes:     General: Lids are normal.     Extraocular Movements: Extraocular movements intact.     Conjunctiva/sclera: Conjunctivae normal.     Pupils: Pupils are equal, round, and reactive to light.     Funduscopic exam:    Right eye: No papilledema.        Left eye: No papilledema.  Neck:     Thyroid: No thyroid mass.     Vascular: No carotid bruit.  Cardiovascular:     Rate and Rhythm: Normal rate and regular rhythm.     Pulses: Normal pulses.     Heart sounds: Normal heart sounds. No murmur heard.   Pulmonary:     Effort: Pulmonary effort is normal.     Breath  sounds: Normal breath sounds.  Chest:     Breasts: Tanner Score is 5.        Right: Normal.        Left: Normal.  Abdominal:     General: Bowel sounds are normal. There is no distension.     Palpations: Abdomen is soft.     Tenderness: There is no abdominal tenderness.  Genitourinary:    Rectum: Guaiac result negative.  Musculoskeletal:        General: No swelling. Normal range of motion.     Cervical back: Full passive range of motion without pain, normal range of motion and neck supple.     Right lower leg: No edema.     Left lower leg: No  edema.  Skin:    General: Skin is warm and dry.     Capillary Refill: Capillary refill takes less than 2 seconds.  Neurological:     General: No focal deficit present.     Mental Status: She is alert and oriented to person, place, and time.     Cranial Nerves: No cranial nerve deficit.     Sensory: No sensory deficit.  Psychiatric:        Mood and Affect: Mood normal.        Behavior: Behavior normal.        Thought Content: Thought content normal.        Judgment: Judgment normal.         Assessment And Plan:     1. Routine general medical examination at a health care facility Comments: A full exam was performed. Importance of monthly self breast exams was discussed with the patient. PATIENT IS ADVISED TO GET 30-45 MINUTES REGULAR EXERCISE NO LESS THAN FOUR TO FIVE DAYS PER WEEK - BOTH WEIGHTBEARING EXERCISES AND AEROBIC ARE RECOMMENDED.  PATIENT IS ADVISED TO FOLLOW A HEALTHY DIET WITH AT LEAST SIX FRUITS/VEGGIES PER DAY, DECREASE INTAKE OF RED MEAT, AND TO INCREASE FISH INTAKE TO TWO DAYS PER WEEK.  MEATS/FISH SHOULD NOT BE FRIED, BAKED OR BROILED IS PREFERABLE.  I SUGGEST WEARING SPF 50 SUNSCREEN ON EXPOSED PARTS AND ESPECIALLY WHEN IN THE DIRECT SUNLIGHT FOR AN EXTENDED PERIOD OF TIME.  PLEASE AVOID FAST FOOD RESTAURANTS AND INCREASE YOUR WATER INTAKE.  - Hepatitis C antibody - CBC - CMP14+EGFR - Lipid panel  2. Essential  hypertension, benign Comments: Chronic, well controlled. She will continue with current meds. She is encouraged to avoid adding salt to her foods. EKG performed, NSR w/. She will rto in six months for re-evaluation.  - POCT Urinalysis Dipstick (81002) - POCT UA - Microalbumin - EKG 12-Lead  3. Class 1 obesity due to excess calories with body mass index (BMI) of 34.0 to 34.9 in adult, unspecified whether serious comorbidity present  She was congratulated on her lifestyle changes and encouraged to keep up the great work. She is encouraged to strive for BMI less than 30 to decrease cardiac risk. Advised to aim for at least 150 minutes of exercise per week.  4. Need for vaccination Comments: She was given Tdap to update her immunization history.   5. Screen for colon cancer  She agrees to GI referral for CRC screening.  Ambulatory referral to Gastroenterology     Patient was given opportunity to ask questions. Patient verbalized understanding of the plan and was able to repeat key elements of the plan. All questions were answered to their satisfaction.   Maximino Greenland, MD   I, Maximino Greenland, MD, have reviewed all documentation for this visit. The documentation on 04/21/20 for the exam, diagnosis, procedures, and orders are all accurate and complete.  THE PATIENT IS ENCOURAGED TO PRACTICE SOCIAL DISTANCING DUE TO THE COVID-19 PANDEMIC.

## 2020-04-21 NOTE — Patient Instructions (Signed)
Health Maintenance, Female Adopting a healthy lifestyle and getting preventive care are important in promoting health and wellness. Ask your health care provider about:  The right schedule for you to have regular tests and exams.  Things you can do on your own to prevent diseases and keep yourself healthy. What should I know about diet, weight, and exercise? Eat a healthy diet   Eat a diet that includes plenty of vegetables, fruits, low-fat dairy products, and lean protein.  Do not eat a lot of foods that are high in solid fats, added sugars, or sodium. Maintain a healthy weight Body mass index (BMI) is used to identify weight problems. It estimates body fat based on height and weight. Your health care provider can help determine your BMI and help you achieve or maintain a healthy weight. Get regular exercise Get regular exercise. This is one of the most important things you can do for your health. Most adults should:  Exercise for at least 150 minutes each week. The exercise should increase your heart rate and make you sweat (moderate-intensity exercise).  Do strengthening exercises at least twice a week. This is in addition to the moderate-intensity exercise.  Spend less time sitting. Even light physical activity can be beneficial. Watch cholesterol and blood lipids Have your blood tested for lipids and cholesterol at 50 years of age, then have this test every 5 years. Have your cholesterol levels checked more often if:  Your lipid or cholesterol levels are high.  You are older than 50 years of age.  You are at high risk for heart disease. What should I know about cancer screening? Depending on your health history and family history, you may need to have cancer screening at various ages. This may include screening for:  Breast cancer.  Cervical cancer.  Colorectal cancer.  Skin cancer.  Lung cancer. What should I know about heart disease, diabetes, and high blood  pressure? Blood pressure and heart disease  High blood pressure causes heart disease and increases the risk of stroke. This is more likely to develop in people who have high blood pressure readings, are of African descent, or are overweight.  Have your blood pressure checked: ? Every 3-5 years if you are 18-39 years of age. ? Every year if you are 40 years old or older. Diabetes Have regular diabetes screenings. This checks your fasting blood sugar level. Have the screening done:  Once every three years after age 40 if you are at a normal weight and have a low risk for diabetes.  More often and at a younger age if you are overweight or have a high risk for diabetes. What should I know about preventing infection? Hepatitis B If you have a higher risk for hepatitis B, you should be screened for this virus. Talk with your health care provider to find out if you are at risk for hepatitis B infection. Hepatitis C Testing is recommended for:  Everyone born from 1945 through 1965.  Anyone with known risk factors for hepatitis C. Sexually transmitted infections (STIs)  Get screened for STIs, including gonorrhea and chlamydia, if: ? You are sexually active and are younger than 50 years of age. ? You are older than 50 years of age and your health care provider tells you that you are at risk for this type of infection. ? Your sexual activity has changed since you were last screened, and you are at increased risk for chlamydia or gonorrhea. Ask your health care provider if   you are at risk.  Ask your health care provider about whether you are at high risk for HIV. Your health care provider may recommend a prescription medicine to help prevent HIV infection. If you choose to take medicine to prevent HIV, you should first get tested for HIV. You should then be tested every 3 months for as long as you are taking the medicine. Pregnancy  If you are about to stop having your period (premenopausal) and  you may become pregnant, seek counseling before you get pregnant.  Take 400 to 800 micrograms (mcg) of folic acid every day if you become pregnant.  Ask for birth control (contraception) if you want to prevent pregnancy. Osteoporosis and menopause Osteoporosis is a disease in which the bones lose minerals and strength with aging. This can result in bone fractures. If you are 65 years old or older, or if you are at risk for osteoporosis and fractures, ask your health care provider if you should:  Be screened for bone loss.  Take a calcium or vitamin D supplement to lower your risk of fractures.  Be given hormone replacement therapy (HRT) to treat symptoms of menopause. Follow these instructions at home: Lifestyle  Do not use any products that contain nicotine or tobacco, such as cigarettes, e-cigarettes, and chewing tobacco. If you need help quitting, ask your health care provider.  Do not use street drugs.  Do not share needles.  Ask your health care provider for help if you need support or information about quitting drugs. Alcohol use  Do not drink alcohol if: ? Your health care provider tells you not to drink. ? You are pregnant, may be pregnant, or are planning to become pregnant.  If you drink alcohol: ? Limit how much you use to 0-1 drink a day. ? Limit intake if you are breastfeeding.  Be aware of how much alcohol is in your drink. In the [place] one drink equals one 12 oz bottle of beer (355 mL), one 5 oz glass of wine (148 mL), or one 1 oz glass of hard liquor (44 mL). General instructions  Schedule regular health, dental, and eye exams.  Stay current with your vaccines.  Tell your health care provider if: ? You often feel depressed. ? You have ever been abused or do not feel safe at home. Summary  Adopting a healthy lifestyle and getting preventive care are important in promoting health and wellness.  Follow your health care provider's instructions about healthy  diet, exercising, and getting tested or screened for diseases.  Follow your health care provider's instructions on monitoring your cholesterol and blood pressure. This information is not intended to replace advice given to you by your health care provider. Make sure you discuss any questions you have with your health care provider. Document Revised: 08/15/2018 Document Reviewed: 08/15/2018 Elsevier Patient Education  2020 Elsevier Inc.  

## 2020-04-22 LAB — CBC
Hematocrit: 41.3 % (ref 34.0–46.6)
Hemoglobin: 14.1 g/dL (ref 11.1–15.9)
MCH: 33.7 pg — ABNORMAL HIGH (ref 26.6–33.0)
MCHC: 34.1 g/dL (ref 31.5–35.7)
MCV: 99 fL — ABNORMAL HIGH (ref 79–97)
Platelets: 235 10*3/uL (ref 150–450)
RBC: 4.19 x10E6/uL (ref 3.77–5.28)
RDW: 12.7 % (ref 11.7–15.4)
WBC: 11.7 10*3/uL — ABNORMAL HIGH (ref 3.4–10.8)

## 2020-04-22 LAB — CMP14+EGFR
ALT: 13 IU/L (ref 0–32)
AST: 17 IU/L (ref 0–40)
Albumin/Globulin Ratio: 1.7 (ref 1.2–2.2)
Albumin: 4.3 g/dL (ref 3.8–4.8)
Alkaline Phosphatase: 69 IU/L (ref 48–121)
BUN/Creatinine Ratio: 16 (ref 9–23)
BUN: 12 mg/dL (ref 6–24)
Bilirubin Total: 0.3 mg/dL (ref 0.0–1.2)
CO2: 27 mmol/L (ref 20–29)
Calcium: 9.7 mg/dL (ref 8.7–10.2)
Chloride: 102 mmol/L (ref 96–106)
Creatinine, Ser: 0.75 mg/dL (ref 0.57–1.00)
GFR calc Af Amer: 107 mL/min/{1.73_m2} (ref >59)
GFR calc non Af Amer: 93 mL/min/{1.73_m2} (ref >59)
Globulin, Total: 2.6 g/dL (ref 1.5–4.5)
Glucose: 72 mg/dL (ref 65–99)
Potassium: 4 mmol/L (ref 3.5–5.2)
Sodium: 141 mmol/L (ref 134–144)
Total Protein: 6.9 g/dL (ref 6.0–8.5)

## 2020-04-22 LAB — LIPID PANEL
Chol/HDL Ratio: 2.6 ratio (ref 0.0–4.4)
Cholesterol, Total: 213 mg/dL — ABNORMAL HIGH (ref 100–199)
HDL: 82 mg/dL (ref >39)
LDL Chol Calc (NIH): 110 mg/dL — ABNORMAL HIGH (ref 0–99)
Triglycerides: 120 mg/dL (ref 0–149)
VLDL Cholesterol Cal: 21 mg/dL (ref 5–40)

## 2020-04-22 LAB — HEPATITIS C ANTIBODY: Hep C Virus Ab: <0.1 s/co ratio (ref 0.0–0.9)

## 2020-05-05 ENCOUNTER — Ambulatory Visit: Payer: BC Managed Care – PPO | Admitting: Internal Medicine

## 2020-07-15 ENCOUNTER — Other Ambulatory Visit: Payer: Self-pay

## 2020-07-15 ENCOUNTER — Encounter: Payer: Self-pay | Admitting: Internal Medicine

## 2020-07-15 DIAGNOSIS — L309 Dermatitis, unspecified: Secondary | ICD-10-CM

## 2020-07-15 MED ORDER — FEXOFENADINE HCL 180 MG PO TABS
180.0000 mg | ORAL_TABLET | Freq: Every day | ORAL | 2 refills | Status: DC
Start: 2020-07-15 — End: 2021-03-11

## 2020-07-15 MED ORDER — CLOBETASOL PROPIONATE 0.05 % EX OINT: 1.0000 "application " | TOPICAL_OINTMENT | Freq: Two times a day (BID) | CUTANEOUS | 3 refills | Status: DC

## 2020-07-15 MED ORDER — TELMISARTAN-HCTZ 80-25 MG PO TABS: 1.0000 | ORAL_TABLET | Freq: Every day | ORAL | 2 refills | Status: DC

## 2020-07-16 ENCOUNTER — Encounter: Payer: Self-pay | Admitting: Internal Medicine

## 2020-09-10 ENCOUNTER — Ambulatory Visit: Payer: BC Managed Care – PPO | Attending: Family

## 2020-09-10 DIAGNOSIS — Z23 Encounter for immunization: Secondary | ICD-10-CM

## 2020-10-27 ENCOUNTER — Ambulatory Visit: Payer: BC Managed Care – PPO | Admitting: Internal Medicine

## 2021-01-06 NOTE — Progress Notes (Signed)
   Covid-19 Vaccination Clinic  Name:  Kenza Munar    MRN: [id number] DOB: [birth year]  01/06/2021  Ms. Rosol was observed post Covid-19 immunization for 15 minutes without incident. She was provided with Vaccine Information Sheet and instruction to access the V-Safe system.   Ms. Dudas was instructed to call 911 with any severe reactions post vaccine: Marland Kitchen Difficulty breathing  . Swelling of face and throat  . A fast heartbeat  . A bad rash all over body  . Dizziness and weakness   Immunizations Administered    Name Date Dose VIS Date Route   Moderna Covid-19 Booster Vaccine 09/10/2020  9:30 PM 0.25 mL 06/24/2020 Intramuscular   Manufacturer: Moderna   Lot: [id number]   [place]: [id number]

## 2021-02-04 ENCOUNTER — Encounter: Payer: Self-pay | Admitting: Internal Medicine

## 2021-03-03 ENCOUNTER — Ambulatory Visit: Payer: Self-pay

## 2021-03-03 ENCOUNTER — Other Ambulatory Visit: Payer: Self-pay

## 2021-03-03 DIAGNOSIS — Z23 Encounter for immunization: Secondary | ICD-10-CM

## 2021-03-03 NOTE — Progress Notes (Signed)
Nurse visit for Hep B vaccine.

## 2021-03-11 ENCOUNTER — Other Ambulatory Visit: Payer: Self-pay

## 2021-03-11 ENCOUNTER — Ambulatory Visit (INDEPENDENT_AMBULATORY_CARE_PROVIDER_SITE_OTHER): Payer: 59 | Admitting: Nurse Practitioner

## 2021-03-11 ENCOUNTER — Encounter: Payer: Self-pay | Admitting: Nurse Practitioner

## 2021-03-11 VITALS — BP 142/80 | HR 70 | Temp 98.2°F | Ht 61.4 in | Wt 201.6 lb

## 2021-03-11 DIAGNOSIS — J3089 Other allergic rhinitis: Secondary | ICD-10-CM

## 2021-03-11 DIAGNOSIS — I10 Essential (primary) hypertension: Secondary | ICD-10-CM

## 2021-03-11 DIAGNOSIS — L309 Dermatitis, unspecified: Secondary | ICD-10-CM

## 2021-03-11 DIAGNOSIS — E661 Drug-induced obesity: Secondary | ICD-10-CM

## 2021-03-11 DIAGNOSIS — E782 Mixed hyperlipidemia: Secondary | ICD-10-CM

## 2021-03-11 DIAGNOSIS — Z6837 Body mass index (BMI) 37.0-37.9, adult: Secondary | ICD-10-CM

## 2021-03-11 MED ORDER — CLOBETASOL PROPIONATE 0.05 % EX OINT: 1.0000 "application " | TOPICAL_OINTMENT | Freq: Two times a day (BID) | CUTANEOUS | 3 refills | Status: DC

## 2021-03-11 MED ORDER — TELMISARTAN-HCTZ 80-25 MG PO TABS: 1.0000 | ORAL_TABLET | Freq: Every day | ORAL | 2 refills | Status: DC

## 2021-03-11 MED ORDER — FEXOFENADINE HCL 180 MG PO TABS: 180.0000 mg | ORAL_TABLET | Freq: Every day | ORAL | 2 refills | Status: DC

## 2021-03-11 NOTE — Progress Notes (Signed)
I,Loretta Norris,acting as a Education administrator for [employer], NP.,have documented all relevant documentation on the behalf of [employer], NP,as directed by  Bary Castilla, NP while in the presence of Bary Castilla, NP.  This visit occurred during the SARS-CoV-2 public health emergency.  Safety protocols were in place, including screening questions prior to the visit, additional usage of staff PPE, and extensive cleaning of exam room while observing appropriate contact time as indicated for disinfecting solutions.  Subjective:     Patient ID: Loretta Norris , female    DOB: [birth year] , 50 y.o.   MRN: [id number]   Chief Complaint  Patient presents with   Hypertension    HPI  Patient is here for htn follow up. She states that sometimes she forgets to take her BP medication. She works [employer]. She is working on her diet and trying to do as much exercise as she can. Otherwise she is doing well. No other concerns today.   BP Readings from Last 3 Encounters: 03/11/21 : (!) 142/80 04/21/20 : 118/74 03/23/20 : 132/84    Hypertension This is a chronic problem. The current episode started more than 1 year ago. The problem has been gradually improving since onset. The problem is controlled. Pertinent negatives include no blurred vision, chest pain, headaches, palpitations or shortness of breath. Risk factors for coronary artery disease include obesity, sedentary lifestyle and smoking/tobacco exposure. The current treatment provides moderate improvement. Compliance problems include exercise.     Past Medical History:  Diagnosis Date   Hypertension      Family History  Problem Relation Age of Onset   Asthma Mother    Hypertension Mother      Current Outpatient Medications:    Cyanocobalamin (VITAMIN B-12 PO), Take 1 tablet by mouth daily., Disp: , Rfl:    Ginger, Zingiber officinalis, (GINGER PO), Take 1 capsule by mouth daily., Disp: , Rfl:    ibuprofen (ADVIL,MOTRIN) 200  MG tablet, Take 600 mg by mouth every 8 (eight) hours as needed for cramping (menstrual cramping)., Disp: , Rfl:    OVER THE COUNTER MEDICATION, Take 8.6 mg by mouth daily. Vegetable Laxative, Disp: , Rfl:    valACYclovir (VALTREX) 500 MG tablet, Take 500 mg by mouth daily as needed (prevent flare ups). , Disp: , Rfl:    clobetasol ointment (TEMOVATE) 8.29 %, Apply 1 application topically 2 (two) times daily., Disp: 30 g, Rfl: 3   fexofenadine (ALLEGRA) 180 MG tablet, Take 1 tablet (180 mg total) by mouth daily., Disp: 30 tablet, Rfl: 2   telmisartan-hydrochlorothiazide (MICARDIS HCT) 80-25 MG tablet, Take 1 tablet by mouth daily., Disp: 90 tablet, Rfl: 2   Allergies  Allergen Reactions   Allevess [Capsaicin-Menthol] Hives and Nausea And Vomiting   Naproxen Sodium Hives and Itching    Can tolerate Ibuprofen OKI     Review of Systems  Constitutional: Negative.  Negative for chills and fatigue.  HENT:  Negative for congestion, sinus pressure and sinus pain.   Eyes:  Negative for blurred vision.  Respiratory: Negative.  Negative for shortness of breath and wheezing.   Cardiovascular: Negative.  Negative for chest pain and palpitations.  Gastrointestinal: Negative.   Neurological: Negative.  Negative for weakness and headaches.    Today's Vitals   03/11/21 0842  BP: (!) 142/80  Pulse: 70  Temp: 98.2 F (36.8 C)  TempSrc: Oral  Weight: 201 lb 9.6 oz (91.4 kg)  Height: 5' 1.4" (1.56 m)   Body mass index is 37.6 kg/m.  Wt Readings from Last 3 Encounters:  03/11/21 201 lb 9.6 oz (91.4 kg)  04/21/20 191 lb 6.4 oz (86.8 kg)  03/23/20 190 lb 9.6 oz (86.5 kg)    Objective:  Physical Exam Constitutional:      Appearance: Normal appearance. She is obese.  HENT:     Head: Normocephalic and atraumatic.  Cardiovascular:     Rate and Rhythm: Normal rate and regular rhythm.     Pulses: Normal pulses.     Heart sounds: Normal heart sounds. No murmur heard. Pulmonary:     Effort:  Pulmonary effort is normal. No respiratory distress.     Breath sounds: Normal breath sounds. No wheezing.  Skin:    General: Skin is warm and dry.     Capillary Refill: Capillary refill takes less than 2 seconds.     Comments: Areas of eczema on her legs.   Neurological:     Mental Status: She is alert and oriented to person, place, and time.        Assessment And Plan:     1. Essential hypertension, benign -Chronic, stable, continue meds  -Limit the intake of processed foods and salt intake. You should increase your intake of green vegetables and fruits. Limit the use of alcohol. Limit fast foods and fried foods. Avoid high fatty saturated and trans fat foods. Keep yourself hydrated with drinking water. Avoid red meats. Eat lean meats instead. Exercise for atleast 30-45 min for atleast 4-5 times a week.  - CMP14+EGFR - CBC no Diff - telmisartan-hydrochlorothiazide (MICARDIS HCT) 80-25 MG tablet; Take 1 tablet by mouth daily.  Dispense: 90 tablet; Refill: 2  2. Mixed hyperlipidemia --Educated patient about a diet that is low in fat and high fatty foods including dairy products. Increase in take of fish and fiber. Decrease intake of red meats and fast foods. Exercise for atleast 4-5 times a week or atleast 30-45 min. Drink a lot of water.   - Lipid panel  3. Eczema, unspecified type - clobetasol ointment (TEMOVATE) 0.05 %; Apply 1 application topically 2 (two) times daily.  Dispense: 30 g; Refill: 3  4. Non-seasonal allergic rhinitis, unspecified trigger -Advised patient to take OTC antihistamines  -Advised patient to avoid triggers  - fexofenadine (ALLEGRA) 180 MG tablet; Take 1 tablet (180 mg total) by mouth daily.  Dispense: 30 tablet; Refill: 2  5. Class 2 drug-induced obesity with serious comorbidity and body mass index (BMI) of 37.0 to 37.9 in adult Advised patient on a healthy diet including avoiding fast food and red meats. Increase the intake of lean meats including grilled  chicken and [place].  Drink a lot of water. Decrease intake of fatty foods. Exercise for 30-45 min. 4-5 a week to decrease the risk of cardiac event.   The patient was encouraged to call or send a message through [place] for any questions or concerns.   Follow up: 3 months for physical exam   Side effects and appropriate use of all the medication(s) were discussed with the patient today. Patient advised to use the medication(s) as directed by their healthcare provider. The patient was encouraged to read, review, and understand all associated package inserts and contact our office with any questions or concerns. The patient accepts the risks of the treatment plan and had an opportunity to ask questions.   Patient was given opportunity to ask questions. Patient verbalized understanding of the plan and was able to repeat key elements of the plan. All questions were answered to  their satisfaction.  Loretta Anureet Bruington, DNP   I, Loretta Norris have reviewed all documentation for this visit. The documentation on 03/11/21 for the exam, diagnosis, procedures, and orders are all accurate and complete.    IF YOU HAVE BEEN REFERRED TO A SPECIALIST, IT MAY TAKE 1-2 WEEKS TO SCHEDULE/PROCESS THE REFERRAL. IF YOU HAVE NOT HEARD FROM US/SPECIALIST IN TWO WEEKS, PLEASE GIVE [place] A CALL AT [phone] X [phone].   THE PATIENT IS ENCOURAGED TO PRACTICE SOCIAL DISTANCING DUE TO THE COVID-19 PANDEMIC.

## 2021-03-11 NOTE — Patient Instructions (Signed)

## 2021-03-12 LAB — LIPID PANEL
Chol/HDL Ratio: 2.3 ratio (ref 0.0–4.4)
Cholesterol, Total: 200 mg/dL — ABNORMAL HIGH (ref 100–199)
HDL: 88 mg/dL (ref >39)
LDL Chol Calc (NIH): 80 mg/dL (ref 0–99)
Triglycerides: 199 mg/dL — ABNORMAL HIGH (ref 0–149)
VLDL Cholesterol Cal: 32 mg/dL (ref 5–40)

## 2021-03-12 LAB — CMP14+EGFR
ALT: 11 IU/L (ref 0–32)
AST: 13 IU/L (ref 0–40)
Albumin/Globulin Ratio: 1.6 (ref 1.2–2.2)
Albumin: 4.3 g/dL (ref 3.8–4.9)
Alkaline Phosphatase: 73 IU/L (ref 44–121)
BUN/Creatinine Ratio: 18 (ref 9–23)
BUN: 12 mg/dL (ref 6–24)
Bilirubin Total: 0.4 mg/dL (ref 0.0–1.2)
CO2: 25 mmol/L (ref 20–29)
Calcium: 9.8 mg/dL (ref 8.7–10.2)
Chloride: 99 mmol/L (ref 96–106)
Creatinine, Ser: 0.66 mg/dL (ref 0.57–1.00)
Globulin, Total: 2.7 g/dL (ref 1.5–4.5)
Glucose: 93 mg/dL (ref 65–99)
Potassium: 4.1 mmol/L (ref 3.5–5.2)
Sodium: 139 mmol/L (ref 134–144)
Total Protein: 7 g/dL (ref 6.0–8.5)
eGFR: 106 mL/min/{1.73_m2} (ref >59)

## 2021-03-12 LAB — CBC
Hematocrit: 42.6 % (ref 34.0–46.6)
Hemoglobin: 14.3 g/dL (ref 11.1–15.9)
MCH: 32.6 pg (ref 26.6–33.0)
MCHC: 33.6 g/dL (ref 31.5–35.7)
MCV: 97 fL (ref 79–97)
Platelets: 248 10*3/uL (ref 150–450)
RBC: 4.39 x10E6/uL (ref 3.77–5.28)
RDW: 13.1 % (ref 11.7–15.4)
WBC: 9.2 10*3/uL (ref 3.4–10.8)

## 2021-03-16 ENCOUNTER — Ambulatory Visit: Payer: Self-pay | Admitting: Nurse Practitioner

## 2021-03-17 ENCOUNTER — Encounter: Payer: Self-pay | Admitting: Internal Medicine

## 2021-03-17 ENCOUNTER — Other Ambulatory Visit: Payer: Self-pay

## 2021-03-17 MED ORDER — TELMISARTAN 80 MG PO TABS: 80.0000 mg | ORAL_TABLET | Freq: Every day | ORAL | 1 refills | Status: DC

## 2021-03-17 MED ORDER — HYDROCHLOROTHIAZIDE 25 MG PO TABS: 25.0000 mg | ORAL_TABLET | Freq: Every day | ORAL | 1 refills | Status: DC

## 2021-04-20 ENCOUNTER — Other Ambulatory Visit: Payer: Self-pay

## 2021-04-20 ENCOUNTER — Ambulatory Visit: Payer: 59

## 2021-04-20 DIAGNOSIS — Z23 Encounter for immunization: Secondary | ICD-10-CM

## 2021-04-21 ENCOUNTER — Ambulatory Visit: Payer: Self-pay

## 2021-04-27 ENCOUNTER — Encounter: Payer: BC Managed Care – PPO | Admitting: Internal Medicine

## 2021-05-12 LAB — HM PAP SMEAR

## 2021-05-12 LAB — HM MAMMOGRAPHY

## 2021-05-14 ENCOUNTER — Encounter: Payer: Self-pay | Admitting: Internal Medicine

## 2021-06-05 ENCOUNTER — Ambulatory Visit (HOSPITAL_COMMUNITY)
Admission: EM | Admit: 2021-06-05 | Discharge: 2021-06-05 | Disposition: A | Discharge status: Home / Self Care | Payer: 59 | Attending: Emergency Medicine | Admitting: Emergency Medicine

## 2021-06-05 ENCOUNTER — Encounter (HOSPITAL_COMMUNITY): Payer: Self-pay

## 2021-06-05 ENCOUNTER — Ambulatory Visit (INDEPENDENT_AMBULATORY_CARE_PROVIDER_SITE_OTHER): Payer: 59

## 2021-06-05 ENCOUNTER — Other Ambulatory Visit: Payer: Self-pay

## 2021-06-05 DIAGNOSIS — M25561 Pain in right knee: Secondary | ICD-10-CM | POA: Diagnosis not present

## 2021-06-05 NOTE — ED Notes (Signed)
Pt refused crutches. States that she may have some at home.

## 2021-06-05 NOTE — Discharge Instructions (Signed)
Wear the knee immobilizer when you are not in bed.  Do not place any weight on your knee, use the crutches when you are standing or walking.   You can take Tylenol and/or Ibuprofen as needed for pain relief and fever reduction.  RICE: Rest as much as possible Ice for 10-15 minutes every 4-6 hours as needed for pain and swelling Compression- use an ace bandage or splint for comfort Elevate above your hip/heart when sitting and laying down  Follow up with orthopedics next week for re-evaluation.

## 2021-06-05 NOTE — ED Triage Notes (Signed)
Pt presents with right knee pain and swelling since yesterday with no known injuries

## 2021-06-05 NOTE — ED Provider Notes (Signed)
[facility]    CSN: [id number] Arrival date & time: 06/05/21  1430      History   Chief Complaint Chief Complaint  Patient presents with   Knee Pain    HPI Loretta Norris is a 50 y.o. female.   Patient here for evaluation of right knee pain and swelling that started yesterday.  Reports swelling and pain worse with movement.  Reports that she does walk a lot in her job I did do an exercise class earlier in the week but denies any injury.  Denies any history of gout.  Reports taking some ibuprofen with minimal symptom relief.  Denies any fevers, chest pain, shortness of breath, N/V/D, numbness, tingling, weakness, abdominal pain, or headaches.    The history is provided by the patient.  Knee Pain  Past Medical History:  Diagnosis Date   Hypertension     Patient Active Problem List   Diagnosis Date Noted   BACTERIAL VAGINITIS 06/01/2010   VAGINAL DISCHARGE 06/01/2010   ONYCHOMYCOSIS, TOENAILS 11/20/2009   SMOKER 09/22/2009   URINALYSIS, ABNORMAL 09/22/2009   LEUKOCYTOSIS 08/24/2009   ESSENTIAL HYPERTENSION, BENIGN 08/13/2009   SEBACEOUS CYST 08/13/2009   CBC, ABNORMAL 05/04/2009   Allergic rhinitis 04/06/2009   LYMPHADENOPATHY, AXILLA 04/06/2009   ELEVATED BP READING WITHOUT DX HYPERTENSION 04/06/2009    Past Surgical History:  Procedure Laterality Date   DILATION AND CURETTAGE OF UTERUS     DILITATION & CURRETTAGE/HYSTROSCOPY WITH THERMACHOICE ABLATION N/A 04/17/2014   Procedure: DILATATION & CURETTAGE/HYSTEROSCOPY WITH THERMACHOICE ABLATION;  Surgeon: Betsy Coder, MD;  Location: [facility];  Service: Gynecology;  Laterality: N/A;   LEG SURGERY     "removal of cyst"   TUBAL LIGATION      OB History   No obstetric history on file.      Home Medications    Prior to Admission medications   Medication Sig Start Date End Date Taking? Authorizing Provider  clobetasol ointment (TEMOVATE) 7.79 % Apply 1 application topically 2 (two) times daily. 03/11/21    Ghumman, Ramandeep, NP  Cyanocobalamin (VITAMIN B-12 PO) Take 1 tablet by mouth daily.    [provider]  fexofenadine (ALLEGRA) 180 MG tablet Take 1 tablet (180 mg total) by mouth daily. 03/11/21   Bary Castilla, NP  Ginger, Zingiber officinalis, (GINGER PO) Take 1 capsule by mouth daily.    [provider]  hydrochlorothiazide (HYDRODIURIL) 25 MG tablet Take 1 tablet (25 mg total) by mouth daily. 03/17/21   Ghumman, Milford Cage, NP  ibuprofen (ADVIL,MOTRIN) 200 MG tablet Take 600 mg by mouth every 8 (eight) hours as needed for cramping (menstrual cramping).    [provider]  OVER THE COUNTER MEDICATION Take 8.6 mg by mouth daily. Vegetable Laxative    [provider]  telmisartan (MICARDIS) 80 MG tablet Take 1 tablet (80 mg total) by mouth daily. 03/17/21   Ghumman, Ramandeep, NP  telmisartan-hydrochlorothiazide (MICARDIS HCT) 80-25 MG tablet Take 1 tablet by mouth daily. 03/11/21   Bary Castilla, NP  valACYclovir (VALTREX) 500 MG tablet Take 500 mg by mouth daily as needed (prevent flare ups).  12/24/16   [provider]    Family History Family History  Problem Relation Age of Onset   Asthma Mother    Hypertension Mother     Social History Social History   Tobacco Use   Smoking status: Every Day    Packs/day: 0.25    Years: 15.00    Pack years: 3.75  Types: Cigarettes   Smokeless tobacco: Never   Tobacco comments:    try to cut back on number of cigs/day  Vaping Use   Vaping Use: Never used  Substance Use Topics   Alcohol use: Yes    Comment: occasional   Drug use: No     Allergies   Allevess [capsaicin-menthol] and Naproxen sodium   Review of Systems Review of Systems  Musculoskeletal:  Positive for arthralgias and joint swelling.  All other systems reviewed and are negative.   Physical Exam Triage Vital Signs ED Triage Vitals  Enc Vitals Group     BP 06/05/21 1451 (!) 146/94     Pulse Rate 06/05/21 1451  81     Resp 06/05/21 1451 17     Temp 06/05/21 1451 98.4 F (36.9 C)     Temp Source 06/05/21 1451 Oral     SpO2 06/05/21 1451 98 %     Weight --      Height --      Head Circumference --      Peak Flow --      Pain Score 06/05/21 1454 8     Pain Loc --      Pain Edu? --      Excl. in [place]? --    No data found.  Updated Vital Signs BP (!) 146/94 (BP Location: Left Arm)   Pulse 81   Temp 98.4 F (36.9 C) (Oral)   Resp 17   SpO2 98%   Visual Acuity Right Eye Distance:   Left Eye Distance:   Bilateral Distance:    Right Eye Near:   Left Eye Near:    Bilateral Near:     Physical Exam Vitals and nursing note reviewed.  Constitutional:      General: She is not in acute distress.    Appearance: Normal appearance. She is not ill-appearing, toxic-appearing or diaphoretic.  HENT:     Head: Normocephalic and atraumatic.  Eyes:     Conjunctiva/sclera: Conjunctivae normal.  Cardiovascular:     Rate and Rhythm: Normal rate.     Pulses: Normal pulses.  Pulmonary:     Effort: Pulmonary effort is normal.  Abdominal:     General: Abdomen is flat.  Musculoskeletal:        General: Normal range of motion.     Cervical back: Normal range of motion.     Right knee: Swelling present. No crepitus. Tenderness present. Normal alignment, normal meniscus and normal patellar mobility. Normal pulse.     Left knee: Normal.  Skin:    General: Skin is warm and dry.  Neurological:     General: No focal deficit present.     Mental Status: She is alert and oriented to person, place, and time.  Psychiatric:        Mood and Affect: Mood normal.     UC Treatments / Results  Labs (all labs ordered are listed, but only abnormal results are displayed) Labs Reviewed - No data to display  EKG   Radiology DG Knee Complete 4 Views Right  Result Date: 06/05/2021 CLINICAL DATA:  Swelling and pain since yesterday. EXAM: RIGHT KNEE - COMPLETE 4+ VIEW COMPARISON:  Right knee radiographs dated  04/14/2015. FINDINGS: There is suggestion of cortical discontinuity of the posterior tibial plateau on the lateral view. No definite tibial plateau fracture is seen on the AP or oblique views. There is a moderate knee joint effusion. 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 IMPRESSION: Possible  cortical discontinuity seen on a single view in combination with a moderate knee joint effusion is concerning for a tibial plateau fracture. Electronically Signed   By: Zerita Boers M.D.   On: 06/05/2021 16:06    Procedures Procedures (including critical care time)  Medications Ordered in UC Medications - No data to display  Initial Impression / Assessment and Plan / UC Course  I have reviewed the triage vital signs and the nursing notes.  Pertinent labs & imaging results that were available during my care of the patient were reviewed by me and considered in my medical decision making (see chart for details).    Assessment negative for red flags or concerns.  X-ray with possible cortical discontinuity with moderate knee effusion concerning for a possible tibial plateau fracture.  Knee immobilizer and crutches given to patient in office.  Patient instructed to be nonweightbearing.  May take Tylenol and or ibuprofen as needed.  Recommend rest, ice, compression, elevation.  Follow-up with orthopedics for reevaluation of soon as possible. Final Clinical Impressions(s) / UC Diagnoses   Final diagnoses:  Acute pain of right knee     Discharge Instructions      Wear the knee immobilizer when you are not in bed.  Do not place any weight on your knee, use the crutches when you are standing or walking.   You can take Tylenol and/or Ibuprofen as needed for pain relief and fever reduction.  RICE: Rest as much as possible Ice for 10-15 minutes every 4-6 hours as needed for pain and swelling Compression- use an ace bandage or splint for comfort Elevate above your hip/heart when sitting and laying  down  Follow up with orthopedics next week for re-evaluation.      ED Prescriptions   None    PDMP not reviewed this encounter.   Pearson Forster, NP 06/05/21 1640

## 2021-06-07 ENCOUNTER — Encounter: Payer: Self-pay | Admitting: Internal Medicine

## 2021-06-09 ENCOUNTER — Encounter: Payer: 59 | Admitting: Internal Medicine

## 2021-06-30 ENCOUNTER — Other Ambulatory Visit: Payer: Self-pay | Admitting: Obstetrics and Gynecology

## 2021-07-20 ENCOUNTER — Encounter: Payer: Self-pay | Admitting: Internal Medicine

## 2021-07-22 ENCOUNTER — Other Ambulatory Visit (HOSPITAL_COMMUNITY)
Admission: RE | Admit: 2021-07-22 | Discharge: 2021-07-22 | Disposition: A | Discharge status: Home / Self Care | Payer: 59 | Source: Ambulatory Visit | Attending: Nurse Practitioner | Admitting: Nurse Practitioner

## 2021-07-22 ENCOUNTER — Other Ambulatory Visit: Payer: Self-pay

## 2021-07-22 ENCOUNTER — Encounter: Payer: Self-pay | Admitting: Nurse Practitioner

## 2021-07-22 ENCOUNTER — Ambulatory Visit (INDEPENDENT_AMBULATORY_CARE_PROVIDER_SITE_OTHER): Payer: 59 | Admitting: Nurse Practitioner

## 2021-07-22 VITALS — BP 122/76 | HR 77 | Temp 97.8°F | Ht 61.4 in | Wt 211.0 lb

## 2021-07-22 DIAGNOSIS — L309 Dermatitis, unspecified: Secondary | ICD-10-CM

## 2021-07-22 DIAGNOSIS — E559 Vitamin D deficiency, unspecified: Secondary | ICD-10-CM | POA: Diagnosis not present

## 2021-07-22 DIAGNOSIS — Z0000 Encounter for general adult medical examination without abnormal findings: Secondary | ICD-10-CM | POA: Diagnosis not present

## 2021-07-22 DIAGNOSIS — R82998 Other abnormal findings in urine: Secondary | ICD-10-CM | POA: Insufficient documentation

## 2021-07-22 DIAGNOSIS — Z6837 Body mass index (BMI) 37.0-37.9, adult: Secondary | ICD-10-CM

## 2021-07-22 DIAGNOSIS — M62838 Other muscle spasm: Secondary | ICD-10-CM

## 2021-07-22 DIAGNOSIS — I10 Essential (primary) hypertension: Secondary | ICD-10-CM | POA: Diagnosis not present

## 2021-07-22 DIAGNOSIS — E782 Mixed hyperlipidemia: Secondary | ICD-10-CM | POA: Diagnosis not present

## 2021-07-22 DIAGNOSIS — E66812 Obesity, class 2: Secondary | ICD-10-CM

## 2021-07-22 DIAGNOSIS — Z13228 Encounter for screening for other metabolic disorders: Secondary | ICD-10-CM | POA: Diagnosis not present

## 2021-07-22 DIAGNOSIS — Z23 Encounter for immunization: Secondary | ICD-10-CM

## 2021-07-22 DIAGNOSIS — R9431 Abnormal electrocardiogram [ECG] [EKG]: Secondary | ICD-10-CM

## 2021-07-22 DIAGNOSIS — J3089 Other allergic rhinitis: Secondary | ICD-10-CM | POA: Diagnosis not present

## 2021-07-22 DIAGNOSIS — Z1211 Encounter for screening for malignant neoplasm of colon: Secondary | ICD-10-CM

## 2021-07-22 LAB — POCT UA - MICROALBUMIN
Albumin/Creatinine Ratio, Urine, POC: <30
Creatinine, POC: 50 mg/dL
Microalbumin Ur, POC: 10 mg/L

## 2021-07-22 LAB — POCT URINALYSIS DIPSTICK
Bilirubin, UA: NEGATIVE
Blood, UA: NEGATIVE
Glucose, UA: NEGATIVE
Ketones, UA: NEGATIVE
Nitrite, UA: NEGATIVE
Protein, UA: NEGATIVE
Spec Grav, UA: 1.01 (ref 1.010–1.025)
Urobilinogen, UA: 0.2 E.U./dL
pH, UA: 6.5 (ref 5.0–8.0)

## 2021-07-22 MED ORDER — CYCLOBENZAPRINE HCL 10 MG PO TABS: 10.0000 mg | ORAL_TABLET | Freq: Three times a day (TID) | ORAL | 0 refills | Status: DC | PRN

## 2021-07-22 MED ORDER — CLOBETASOL PROPIONATE 0.05 % EX OINT: 1.0000 "application " | TOPICAL_OINTMENT | Freq: Two times a day (BID) | CUTANEOUS | 3 refills | Status: DC

## 2021-07-22 MED ORDER — FEXOFENADINE HCL 180 MG PO TABS: 180.0000 mg | ORAL_TABLET | Freq: Every day | ORAL | 2 refills | Status: DC

## 2021-07-22 NOTE — Patient Instructions (Signed)

## 2021-07-22 NOTE — Addendum Note (Signed)
Addended by: Michelle Nasuti on: 07/22/2021 12:55 PM   Modules accepted: Orders

## 2021-07-22 NOTE — Progress Notes (Signed)
I,Katawbba Wiggins,acting as a Education administrator for [employer], NP.,have documented all relevant documentation on the behalf of [employer], NP,as directed by  Bary Castilla, NP while in the presence of Bary Castilla, NP.  This visit occurred during the SARS-CoV-2 public health emergency.  Safety protocols were in place, including screening questions prior to the visit, additional usage of staff PPE, and extensive cleaning of exam room while observing appropriate contact time as indicated for disinfecting solutions.  Subjective:     Patient ID: Loretta Norris , female    DOB: 1971/06/17 , 50 y.o.   MRN: [id number]   Chief Complaint  Patient presents with   Annual Exam     HPI  She is here today for a full physical examination. She is followed by Dr. Charlesetta Garibaldi for her GYN exams. She was last seen in September 2022.They found some fibroids in the uterus by her OBGYN. She is going back for her biopsy. She got her mammogram done this year. She has no other concerns today. She needs a refill on her cream for her eczema and allegra. She also states she has some muscle/joint aches. Since she has been taking meloxicam she has noticed more rash, hence she is allergic to naproxen sodium. But she is no longer taking it.   Hypertension This is a chronic problem. The current episode started more than 1 year ago. The problem has been gradually improving since onset. The problem is controlled. Pertinent negatives include no blurred vision, chest pain, headaches, palpitations or shortness of breath.    Past Medical History:  Diagnosis Date   Hypertension      Family History  Problem Relation Age of Onset   Asthma Mother    Hypertension Mother      Current Outpatient Medications:    Cyanocobalamin (VITAMIN B-12 PO), Take 1 tablet by mouth daily., Disp: , Rfl:    cyclobenzaprine (FLEXERIL) 10 MG tablet, Take 1 tablet (10 mg total) by mouth 3 (three) times daily as needed for muscle spasms.,  Disp: 30 tablet, Rfl: 0   Ginger, Zingiber officinalis, (GINGER PO), Take 1 capsule by mouth daily., Disp: , Rfl:    hydrochlorothiazide (HYDRODIURIL) 25 MG tablet, Take 1 tablet (25 mg total) by mouth daily., Disp: 90 tablet, Rfl: 1   ibuprofen (ADVIL,MOTRIN) 200 MG tablet, Take 600 mg by mouth every 8 (eight) hours as needed for cramping (menstrual cramping)., Disp: , Rfl:    OVER THE COUNTER MEDICATION, Take 8.6 mg by mouth daily. Vegetable Laxative, Disp: , Rfl:    telmisartan (MICARDIS) 80 MG tablet, Take 1 tablet (80 mg total) by mouth daily., Disp: 90 tablet, Rfl: 1   valACYclovir (VALTREX) 500 MG tablet, Take 500 mg by mouth daily as needed (prevent flare ups). , Disp: , Rfl:    clobetasol ointment (TEMOVATE) 0.93 %, Apply 1 application topically 2 (two) times daily., Disp: 30 g, Rfl: 3   fexofenadine (ALLEGRA) 180 MG tablet, Take 1 tablet (180 mg total) by mouth daily., Disp: 30 tablet, Rfl: 2   telmisartan-hydrochlorothiazide (MICARDIS HCT) 80-25 MG tablet, Take 1 tablet by mouth daily. (Patient not taking: Reported on 07/22/2021), Disp: 90 tablet, Rfl: 2   Allergies  Allergen Reactions   Allevess [Capsaicin-Menthol] Hives and Nausea And Vomiting   Naproxen Sodium Hives and Itching    Can tolerate Ibuprofen OKI      The patient states she uses none for birth control. Last LMP was No LMP recorded. Patient is premenopausal.. Negative for Dysmenorrhea. Negative  for: breast discharge, breast lump(s), breast pain and breast self exam. Associated symptoms include abnormal vaginal bleeding. Pertinent negatives include abnormal bleeding (hematology), anxiety, decreased libido, depression, difficulty falling sleep, dyspareunia, history of infertility, nocturia, sexual dysfunction, sleep disturbances, urinary incontinence, urinary urgency, vaginal discharge and vaginal itching. Diet regular.The patient states her exercise level is    . The patient's tobacco use is:  Social History   Tobacco  Use  Smoking Status Every Day   Packs/day: 0.25   Years: 15.00   Pack years: 3.75   Types: Cigarettes  Smokeless Tobacco Never  Tobacco Comments   try to cut back on number of cigs/day  . She has been exposed to passive smoke. The patient's alcohol use is:  Social History   Substance and Sexual Activity  Alcohol Use Yes   Comment: occasional  . Additional information: Last pap 2022, next one scheduled for 2025.    Review of Systems  Constitutional: Negative.  Negative for chills and fever.  HENT: Negative.  Negative for sinus pain.   Eyes: Negative.  Negative for blurred vision.  Respiratory: Negative.  Negative for cough and shortness of breath.   Cardiovascular: Negative.  Negative for chest pain and palpitations.  Gastrointestinal: Negative.  Negative for constipation, diarrhea and nausea.  Endocrine: Negative.  Negative for polydipsia, polyphagia and polyuria.  Genitourinary: Negative.   Musculoskeletal: Negative.        Muscle/joint aches  Skin:  Positive for rash.  Allergic/Immunologic: Negative.   Neurological: Negative.  Negative for dizziness, weakness and headaches.  Hematological: Negative.   Psychiatric/Behavioral: Negative.      Today's Vitals   07/22/21 1108  BP: 122/76  Pulse: 77  Temp: 97.8 F (36.6 C)  Weight: 211 lb (95.7 kg)  Height: 5' 1.4" (1.56 m)   Body mass index is 39.35 kg/m.   Objective:  Physical Exam Vitals and nursing note reviewed.  Constitutional:      Appearance: Normal appearance. She is obese.  HENT:     Head: Normocephalic and atraumatic.     Right Ear: Tympanic membrane, ear canal and external ear normal. There is no impacted cerumen.     Left Ear: Tympanic membrane, ear canal and external ear normal. There is no impacted cerumen.     Nose: Nose normal. No congestion.     Mouth/Throat:     Mouth: Mucous membranes are moist.     Pharynx: Oropharynx is clear.  Eyes:     Extraocular Movements: Extraocular movements intact.      Conjunctiva/sclera: Conjunctivae normal.     Pupils: Pupils are equal, round, and reactive to light.  Cardiovascular:     Rate and Rhythm: Normal rate and regular rhythm.     Pulses: Normal pulses.     Heart sounds: Normal heart sounds. No murmur heard. Pulmonary:     Effort: Pulmonary effort is normal. No respiratory distress.     Breath sounds: Normal breath sounds. No wheezing.  Chest:     Comments: Deferred  Abdominal:     General: Abdomen is flat. Bowel sounds are normal.     Palpations: Abdomen is soft.  Genitourinary:    Comments: deferred Musculoskeletal:        General: Normal range of motion.     Cervical back: Normal range of motion and neck supple.  Skin:    General: Skin is warm and dry.     Capillary Refill: Capillary refill takes less than 2 seconds.     Coloration: Skin  is ashen.     Findings: Rash present. Rash is scaling.  Neurological:     General: No focal deficit present.     Mental Status: She is alert and oriented to person, place, and time.  Psychiatric:        Mood and Affect: Mood normal.        Behavior: Behavior normal.        Assessment And Plan:     1. Routine general medical examination at a health care facility --Patient is here for their annual physical exam and we discussed any changes to medication and medical history.  -Behavior modification was discussed as well as diet and exercise history  -Patient will continue to exercise regularly and modify their diet.  -Recommendation for yearly physical annuals, immunization and screenings including mammogram and colonoscopy were discussed with the patient.  -Recommended intake of multivitamin, vitamin D and calcium.  -Individualized advise was given to the patient pertaining to their own health history in regards to diet, exercise, medical condition and referrals.   2. Essential hypertension, benign -Limit the intake of processed foods and salt intake. You should increase your intake of  green vegetables and fruits. Limit the use of alcohol. Limit fast foods and fried foods. Avoid high fatty saturated and trans fat foods. Keep yourself hydrated with drinking water. Avoid red meats. Eat lean meats instead. Exercise for atleast 30-45 min for atleast 4-5 times a week.  - POCT Urinalysis Dipstick (81002) - POCT UA - Microalbumin - EKG 12-Lead - CBC - CMP14+EGFR  3. Mixed hyperlipidemia --Educated patient about a diet that is low in fat and high fatty foods including dairy products. Increase in take of fish and fiber. Decrease intake of red meats and fast foods. Exercise for atleast 4-5 times a week or atleast 30-45 min. Drink a lot of water.   - Lipid panel  4. Encounter for screening colonoscopy - Ambulatory referral to Gastroenterology  5. Encounter for screening for metabolic disorder - Hemoglobin A1c  6. Abnormal EKG -EKG shows nonspecific T-abnormality with rightward P axis and rotation-"possible pulmonary disease". Different from previous EKG  - Ambulatory referral to Cardiology  7. Non-seasonal allergic rhinitis, unspecified trigger - fexofenadine (ALLEGRA) 180 MG tablet; Take 1 tablet (180 mg total) by mouth daily.  Dispense: 30 tablet; Refill: 2  8. Eczema, unspecified type - clobetasol ointment (TEMOVATE) 0.05 %; Apply 1 application topically 2 (two) times daily.  Dispense: 30 g; Refill: 3  9. Vitamin D deficiency -Will check and supplement if needed. Advised patient to spend atleast 15 min. Daily in sunlight.  - Vitamin D (25 hydroxy)  10. Muscle spasms of both lower extremities - cyclobenzaprine (FLEXERIL) 10 MG tablet; Take 1 tablet (10 mg total) by mouth 3 (three) times daily as needed for muscle spasms.  Dispense: 30 tablet; Refill: 0  11. Class 2 severe obesity due to excess calories with serious comorbidity and body mass index (BMI) of 37.0 to 37.9 in adult [facility]) Advised patient on a healthy diet including avoiding fast food and red meats. Increase the  intake of lean meats including grilled chicken and [place].  Drink a lot of water. Decrease intake of fatty foods. Exercise for 30-45 min. 4-5 a week to decrease the risk of cardiac event.   The patient was encouraged to call or send a message through [place] for any questions or concerns.   Follow up: if symptoms persist or do not get better.   Side effects and appropriate use  of all the medication(s) were discussed with the patient today. Patient advised to use the medication(s) as directed by their healthcare provider. The patient was encouraged to read, review, and understand all associated package inserts and contact our office with any questions or concerns. The patient accepts the risks of the treatment plan and had an opportunity to ask questions.   Staying healthy and adopting a healthy lifestyle for your overall health is important. You should eat 7 or more servings of fruits and vegetables per day. You should drink plenty of water to keep yourself hydrated and your kidneys healthy. This includes about 65-80+ fluid ounces of water. Limit your intake of animal fats especially for elevated cholesterol. Avoid highly processed food and limit your salt intake if you have hypertension. Avoid foods high in saturated/Trans fats. Along with a healthy diet it is also very important to maintain time for yourself to maintain a healthy mental health with low stress levels. You should get atleast 150 min of moderate intensity exercise weekly for a healthy heart. Along with eating right and exercising, aim for at least 7-9 hours of sleep daily.  Eat more whole grains which includes barley, wheat berries, oats, brown rice and whole wheat pasta. Use healthy plant oils which include olive, soy, corn, sunflower and peanut. Limit your caffeine and sugary drinks. Limit your intake of fast foods. Limit milk and dairy products to one or two daily servings.   Patient was given opportunity to ask questions. Patient  verbalized understanding of the plan and was able to repeat key elements of the plan. All questions were answered to their satisfaction.  Raman Eldra Word, DNP   I, Raman Mickaela Starlin have reviewed all documentation for this visit. The documentation on 07/22/21 for the exam, diagnosis, procedures, and orders are all accurate and complete.    THE PATIENT IS ENCOURAGED TO PRACTICE SOCIAL DISTANCING DUE TO THE COVID-19 PANDEMIC.

## 2021-07-22 NOTE — Addendum Note (Signed)
Addended by: Michelle Nasuti on: 07/22/2021 12:50 PM   Modules accepted: Orders

## 2021-07-23 LAB — CBC
Hematocrit: 43.2 % (ref 34.0–46.6)
Hemoglobin: 14.4 g/dL (ref 11.1–15.9)
MCH: 32.5 pg (ref 26.6–33.0)
MCHC: 33.3 g/dL (ref 31.5–35.7)
MCV: 98 fL — ABNORMAL HIGH (ref 79–97)
Platelets: 240 10*3/uL (ref 150–450)
RBC: 4.43 x10E6/uL (ref 3.77–5.28)
RDW: 13.2 % (ref 11.7–15.4)
WBC: 13.2 10*3/uL — ABNORMAL HIGH (ref 3.4–10.8)

## 2021-07-23 LAB — LIPID PANEL
Chol/HDL Ratio: 2.4 ratio (ref 0.0–4.4)
Cholesterol, Total: 227 mg/dL — ABNORMAL HIGH (ref 100–199)
HDL: 93 mg/dL (ref >39)
LDL Chol Calc (NIH): 111 mg/dL — ABNORMAL HIGH (ref 0–99)
Triglycerides: 137 mg/dL (ref 0–149)
VLDL Cholesterol Cal: 23 mg/dL (ref 5–40)

## 2021-07-23 LAB — CMP14+EGFR
ALT: 14 IU/L (ref 0–32)
AST: 20 IU/L (ref 0–40)
Albumin/Globulin Ratio: 1.9 (ref 1.2–2.2)
Albumin: 4.7 g/dL (ref 3.8–4.9)
Alkaline Phosphatase: 78 IU/L (ref 44–121)
BUN/Creatinine Ratio: 18 (ref 9–23)
BUN: 14 mg/dL (ref 6–24)
Bilirubin Total: 0.3 mg/dL (ref 0.0–1.2)
CO2: 23 mmol/L (ref 20–29)
Calcium: 9.7 mg/dL (ref 8.7–10.2)
Chloride: 102 mmol/L (ref 96–106)
Creatinine, Ser: 0.78 mg/dL (ref 0.57–1.00)
Globulin, Total: 2.5 g/dL (ref 1.5–4.5)
Glucose: 77 mg/dL (ref 70–99)
Potassium: 4.7 mmol/L (ref 3.5–5.2)
Sodium: 142 mmol/L (ref 134–144)
Total Protein: 7.2 g/dL (ref 6.0–8.5)
eGFR: 92 mL/min/{1.73_m2} (ref >59)

## 2021-07-23 LAB — HEMOGLOBIN A1C
Est. average glucose Bld gHb Est-mCnc: 108 mg/dL
Hgb A1c MFr Bld: 5.4 % (ref 4.8–5.6)

## 2021-07-23 LAB — VITAMIN D 25 HYDROXY (VIT D DEFICIENCY, FRACTURES): Vit D, 25-Hydroxy: 20.5 ng/mL — ABNORMAL LOW (ref 30.0–100.0)

## 2021-07-25 LAB — URINE CULTURE: Culture: 40000 — AB

## 2021-07-27 ENCOUNTER — Other Ambulatory Visit: Payer: Self-pay | Admitting: Nurse Practitioner

## 2021-07-27 DIAGNOSIS — B9620 Unspecified Escherichia coli [E. coli] as the cause of diseases classified elsewhere: Secondary | ICD-10-CM

## 2021-07-27 DIAGNOSIS — N390 Urinary tract infection, site not specified: Secondary | ICD-10-CM

## 2021-07-27 MED ORDER — NITROFURANTOIN MONOHYD MACRO 100 MG PO CAPS: 100.0000 mg | ORAL_CAPSULE | Freq: Two times a day (BID) | ORAL | 0 refills | Status: AC

## 2021-07-27 NOTE — Progress Notes (Signed)
Her urine showed e-coli, even though she is not showing symptoms I would recommend her to take the antx. She may not have any symptoms and still have the infection.

## 2021-07-28 ENCOUNTER — Encounter: Payer: Self-pay | Admitting: Nurse Practitioner

## 2021-08-10 ENCOUNTER — Ambulatory Visit: Payer: 59

## 2021-10-05 ENCOUNTER — Encounter: Payer: Self-pay | Admitting: Nurse Practitioner

## 2021-10-05 ENCOUNTER — Ambulatory Visit: Payer: 59 | Admitting: Nurse Practitioner

## 2021-10-05 ENCOUNTER — Other Ambulatory Visit: Payer: Self-pay

## 2021-10-05 VITALS — BP 132/94 | HR 85 | Temp 98.1°F | Ht 61.0 in | Wt 214.2 lb

## 2021-10-05 DIAGNOSIS — E782 Mixed hyperlipidemia: Secondary | ICD-10-CM | POA: Diagnosis not present

## 2021-10-05 DIAGNOSIS — I10 Essential (primary) hypertension: Secondary | ICD-10-CM | POA: Diagnosis not present

## 2021-10-05 DIAGNOSIS — Z6841 Body Mass Index (BMI) 40.0 and over, adult: Secondary | ICD-10-CM

## 2021-10-05 MED ORDER — TELMISARTAN-HCTZ 80-25 MG PO TABS: 1.0000 | ORAL_TABLET | Freq: Every day | ORAL | 1 refills | Status: DC

## 2021-10-05 NOTE — Progress Notes (Signed)
I,Victoria T Hamilton,acting as a Education administrator for Minette Brine, FNP.,have documented all relevant documentation on the behalf of Minette Brine, FNP,as directed by  Minette Brine, FNP while in the presence of Minette Brine, [place].   This visit occurred during the SARS-CoV-2 public health emergency.  Safety protocols were in place, including screening questions prior to the visit, additional usage of staff PPE, and extensive cleaning of exam room while observing appropriate contact time as indicated for disinfecting solutions.  Subjective:     Patient ID: Loretta Norris , female    DOB: [birth year] , 51 y.o.   MRN: [id number]   Chief Complaint  Patient presents with   Hypertension    HPI  Pt presents today for BPC. She has no questions or concerns.     Past Medical History:  Diagnosis Date   Hypertension      Family History  Problem Relation Age of Onset   Asthma Mother    Hypertension Mother      Current Outpatient Medications:    clobetasol ointment (TEMOVATE) 4.09 %, Apply 1 application topically 2 (two) times daily., Disp: 30 g, Rfl: 3   Cyanocobalamin (VITAMIN B-12 PO), Take 1 tablet by mouth daily., Disp: , Rfl:    cyclobenzaprine (FLEXERIL) 10 MG tablet, Take 1 tablet (10 mg total) by mouth 3 (three) times daily as needed for muscle spasms., Disp: 30 tablet, Rfl: 0   fexofenadine (ALLEGRA) 180 MG tablet, Take 1 tablet (180 mg total) by mouth daily., Disp: 30 tablet, Rfl: 2   ibuprofen (ADVIL,MOTRIN) 200 MG tablet, Take 600 mg by mouth every 8 (eight) hours as needed for cramping (menstrual cramping)., Disp: , Rfl:    losartan (COZAAR) 50 MG tablet, Take 1 tablet (50 mg total) by mouth daily., Disp: 90 tablet, Rfl: 1   OVER THE COUNTER MEDICATION, Take 8.6 mg by mouth daily. Vegetable Laxative, Disp: , Rfl:    valACYclovir (VALTREX) 500 MG tablet, Take 500 mg by mouth daily as needed (prevent flare ups). , Disp: , Rfl:    Ginger, Zingiber officinalis, (GINGER PO), Take 1 capsule by  mouth daily., Disp: , Rfl:    hydrochlorothiazide (HYDRODIURIL) 25 MG tablet, Take 1 tablet (25 mg total) by mouth daily., Disp: 90 tablet, Rfl: 0   Allergies  Allergen Reactions   Allevess [Capsaicin-Menthol] Hives and Nausea And Vomiting   Naproxen Sodium Hives and Itching    Can tolerate Ibuprofen OKI     Review of Systems  Constitutional: Negative.   Respiratory: Negative.    Cardiovascular: Negative.   Neurological: Negative.   Psychiatric/Behavioral: Negative.      Today's Vitals   10/05/21 1559 10/05/21 1621  BP: 132/90 (!) 132/94  Pulse: 86 85  Temp: 98.1 F (36.7 C)   Weight: 214 lb 3.2 oz (97.2 kg)   Height: _0  (1.549 m)    Body mass index is 40.47 kg/m.  Wt Readings from Last 3 Encounters:  10/05/21 214 lb 3.2 oz (97.2 kg)  07/22/21 211 lb (95.7 kg)  03/11/21 201 lb 9.6 oz (91.4 kg)     Objective:  Physical Exam Vitals reviewed.  Constitutional:      General: She is not in acute distress.    Appearance: Normal appearance. She is well-developed. She is obese.  HENT:     Right Ear: Hearing normal.     Left Ear: Hearing normal.  Eyes:     General: Lids are normal.     Extraocular Movements: Extraocular movements intact.  Conjunctiva/sclera: Conjunctivae normal.     Pupils: Pupils are equal, round, and reactive to light.     Funduscopic exam:    Right eye: No papilledema.        Left eye: No papilledema.  Neck:     Thyroid: No thyroid mass.     Vascular: No carotid bruit.  Cardiovascular:     Rate and Rhythm: Normal rate and regular rhythm.     Pulses: Normal pulses.     Heart sounds: Normal heart sounds. No murmur heard. Pulmonary:     Effort: Pulmonary effort is normal. No respiratory distress.     Breath sounds: Normal breath sounds. No wheezing.  Musculoskeletal:     Cervical back: Full passive range of motion without pain, normal range of motion and neck supple.  Skin:    General: Skin is warm and dry.     Capillary Refill: Capillary  refill takes less than 2 seconds.  Neurological:     General: No focal deficit present.     Mental Status: She is alert and oriented to person, place, and time.     Cranial Nerves: No cranial nerve deficit.     Sensory: No sensory deficit.     Motor: No weakness.  Psychiatric:        Mood and Affect: Mood normal.        Behavior: Behavior normal. Behavior is cooperative.        Thought Content: Thought content normal.        Judgment: Judgment normal.        Assessment And Plan:     1. Essential hypertension, benign Comments: Blood pressure is slightly elevated, she is encouraged to make sure she is taking her medications regularly and avoiding a high salt diet - BMP8+eGFR; Future  2. Mixed hyperlipidemia Comments: Cholesterol levels had improved at last visit, No current medications continue diet controlled. - Lipid panel; Future - BMP8+eGFR; Future  3. Class 3 severe obesity due to excess calories without serious comorbidity with body mass index (BMI) of 40.0 to 44.9 in adult [facility]) She is encouraged to strive for BMI less than 30 to decrease cardiac risk. Advised to aim for at least 150 minutes of exercise per week.  Discussed healthy diet and regular exercise options     Patient was given opportunity to ask questions. Patient verbalized understanding of the plan and was able to repeat key elements of the plan. All questions were answered to their satisfaction.  Minette Brine, FNP   I, Minette Brine, FNP, have reviewed all documentation for this visit. The documentation on 10/05/21 for the exam, diagnosis, procedures, and orders are all accurate and complete.   IF YOU HAVE BEEN REFERRED TO A SPECIALIST, IT MAY TAKE 1-2 WEEKS TO SCHEDULE/PROCESS THE REFERRAL. IF YOU HAVE NOT HEARD FROM US/SPECIALIST IN TWO WEEKS, PLEASE GIVE [place] A CALL AT [phone].   THE PATIENT IS ENCOURAGED TO PRACTICE SOCIAL DISTANCING DUE TO THE COVID-19 PANDEMIC.

## 2021-10-05 NOTE — Patient Instructions (Signed)

## 2021-10-06 ENCOUNTER — Encounter: Payer: Self-pay | Admitting: Nurse Practitioner

## 2021-10-06 ENCOUNTER — Other Ambulatory Visit: Payer: Self-pay

## 2021-10-06 MED ORDER — TELMISARTAN 80 MG PO TABS: 80.0000 mg | ORAL_TABLET | Freq: Every day | ORAL | 0 refills | Status: DC

## 2021-10-06 MED ORDER — HYDROCHLOROTHIAZIDE 25 MG PO TABS: 25.0000 mg | ORAL_TABLET | Freq: Every day | ORAL | 0 refills | Status: DC

## 2021-10-11 ENCOUNTER — Encounter: Payer: Self-pay | Admitting: Internal Medicine

## 2021-10-14 ENCOUNTER — Other Ambulatory Visit: Payer: Self-pay | Admitting: Nurse Practitioner

## 2021-10-14 MED ORDER — LOSARTAN POTASSIUM 50 MG PO TABS: 50.0000 mg | ORAL_TABLET | Freq: Every day | ORAL | 1 refills | Status: DC

## 2021-10-29 ENCOUNTER — Encounter: Payer: Self-pay | Admitting: Internal Medicine

## 2021-11-02 ENCOUNTER — Other Ambulatory Visit: Payer: Self-pay

## 2021-11-02 ENCOUNTER — Ambulatory Visit: Payer: 59

## 2021-11-02 VITALS — BP 138/82 | HR 77 | Temp 97.8°F | Ht 61.0 in | Wt 211.2 lb

## 2021-11-02 DIAGNOSIS — I10 Essential (primary) hypertension: Secondary | ICD-10-CM

## 2021-11-02 DIAGNOSIS — E782 Mixed hyperlipidemia: Secondary | ICD-10-CM

## 2021-11-02 MED ORDER — LOSARTAN POTASSIUM-HCTZ 100-25 MG PO TABS: 1.0000 | ORAL_TABLET | Freq: Every day | ORAL | 1 refills | Status: DC

## 2021-11-02 NOTE — Progress Notes (Signed)
Patient is here for BPC. She is currently on Losartan 50mg  and hctz 25mg  BP Readings from Last 3 Encounters:  11/02/21 (!) 144/82  10/05/21 (!) 132/94  07/22/21 122/76   BP Readings from Last 3 Encounters:  11/02/21 138/82  10/05/21 (!) 132/94  07/22/21 122/76  Provider increased losartan to 100mg . Will follow up in 2 weeks with nurse visit

## 2021-11-03 LAB — LIPID PANEL
Chol/HDL Ratio: 2.1 ratio (ref 0.0–4.4)
Cholesterol, Total: 202 mg/dL — ABNORMAL HIGH (ref 100–199)
HDL: 95 mg/dL (ref >39)
LDL Chol Calc (NIH): 89 mg/dL (ref 0–99)
Triglycerides: 105 mg/dL (ref 0–149)
VLDL Cholesterol Cal: 18 mg/dL (ref 5–40)

## 2021-11-03 LAB — BMP8+EGFR
BUN/Creatinine Ratio: 19 (ref 9–23)
BUN: 12 mg/dL (ref 6–24)
CO2: 26 mmol/L (ref 20–29)
Calcium: 9.8 mg/dL (ref 8.7–10.2)
Chloride: 97 mmol/L (ref 96–106)
Creatinine, Ser: 0.62 mg/dL (ref 0.57–1.00)
Glucose: 78 mg/dL (ref 70–99)
Potassium: 3.9 mmol/L (ref 3.5–5.2)
Sodium: 136 mmol/L (ref 134–144)
eGFR: 107 mL/min/{1.73_m2} (ref >59)

## 2021-11-16 ENCOUNTER — Ambulatory Visit: Payer: 59

## 2021-11-16 ENCOUNTER — Other Ambulatory Visit: Payer: Self-pay

## 2021-11-16 VITALS — BP 116/82 | HR 82 | Temp 98.0°F | Ht 61.0 in | Wt 211.8 lb

## 2021-11-16 DIAGNOSIS — L309 Dermatitis, unspecified: Secondary | ICD-10-CM

## 2021-11-16 DIAGNOSIS — I10 Essential (primary) hypertension: Secondary | ICD-10-CM

## 2021-11-16 MED ORDER — CLOBETASOL PROPIONATE 0.05 % EX OINT: 1.0000 "application " | TOPICAL_OINTMENT | Freq: Two times a day (BID) | CUTANEOUS | 3 refills | Status: AC

## 2021-11-16 NOTE — Patient Instructions (Signed)

## 2021-11-16 NOTE — Progress Notes (Signed)
The patient is here today for a blood pressure check. The patient's blood pressure was rechecked after sitting.  Her second reading was normal.  The patient was told that Laurance Flatten, DNP, FNP-BC said to continue with her current medications and to keep her next scheduled appointment.  ?

## 2021-12-07 ENCOUNTER — Encounter: Payer: Self-pay | Admitting: Internal Medicine

## 2021-12-15 ENCOUNTER — Encounter: Payer: Self-pay | Admitting: Internal Medicine

## 2021-12-15 ENCOUNTER — Other Ambulatory Visit: Payer: Self-pay

## 2021-12-15 DIAGNOSIS — J3089 Other allergic rhinitis: Secondary | ICD-10-CM

## 2021-12-15 MED ORDER — FEXOFENADINE HCL 180 MG PO TABS: 180.0000 mg | ORAL_TABLET | Freq: Every day | ORAL | 2 refills | Status: DC

## 2022-01-08 IMAGING — DX DG KNEE COMPLETE 4+V*R*
4 series · 4 of 4 positions shown · non-contrast
Comparison: Right knee radiographs dated 04/14/2015.

CLINICAL DATA: Swelling and pain since yesterday.

EXAM:
RIGHT KNEE - COMPLETE 4+ VIEW

[knee ap]
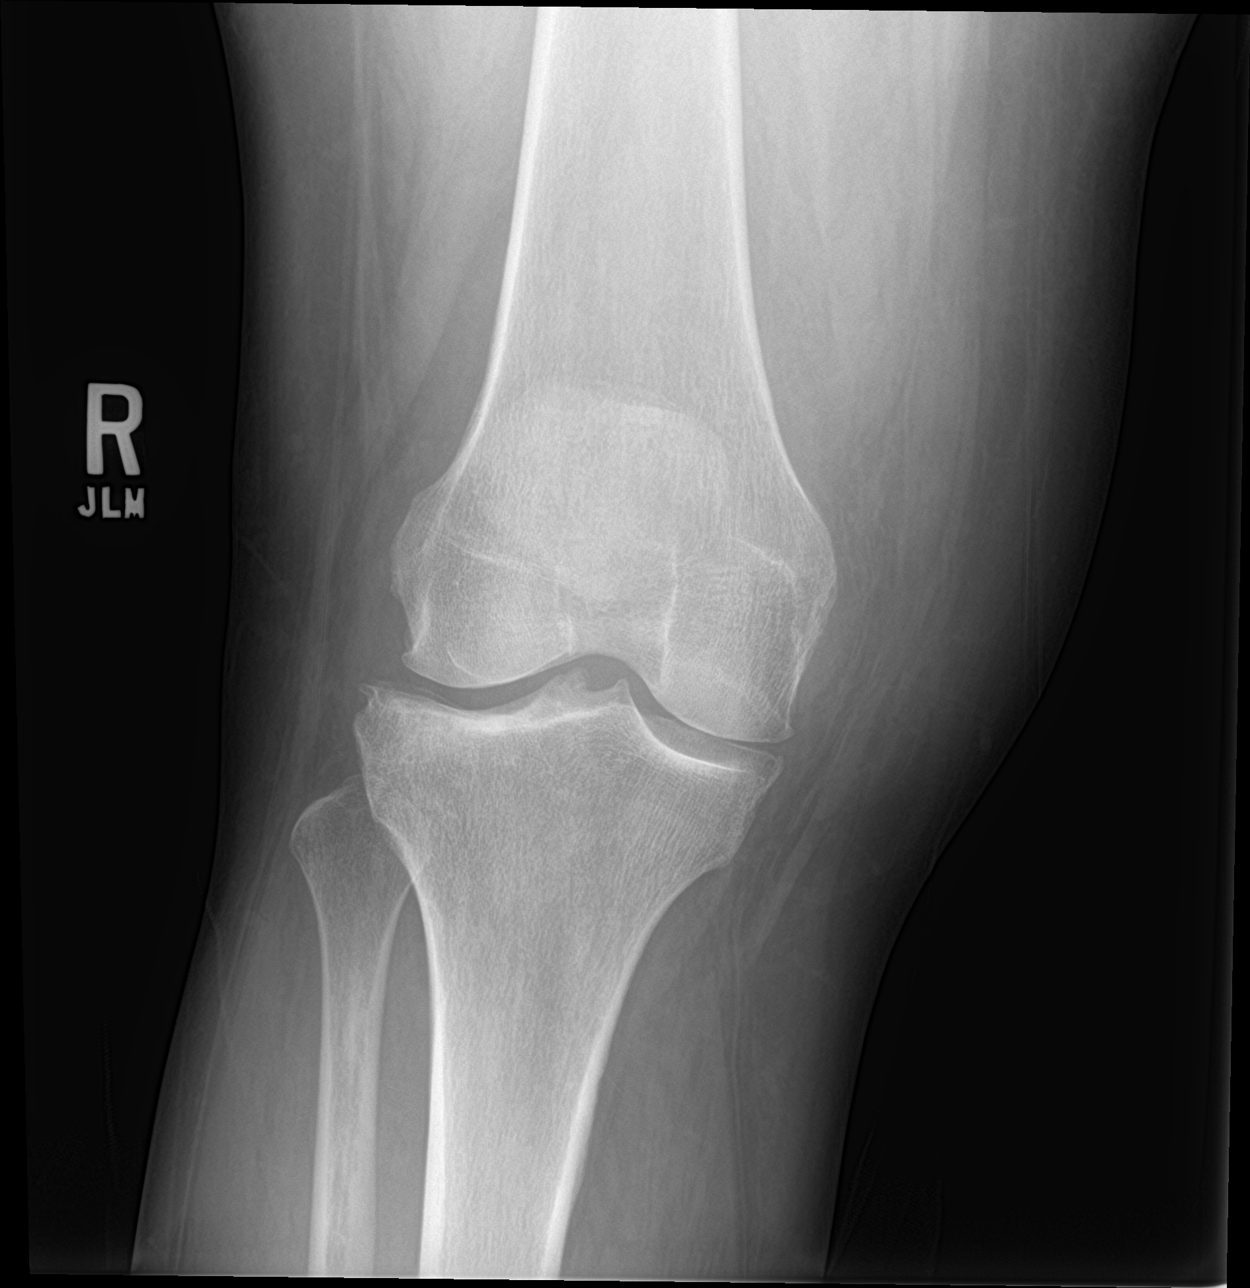

[knee obl (1 of 2)]
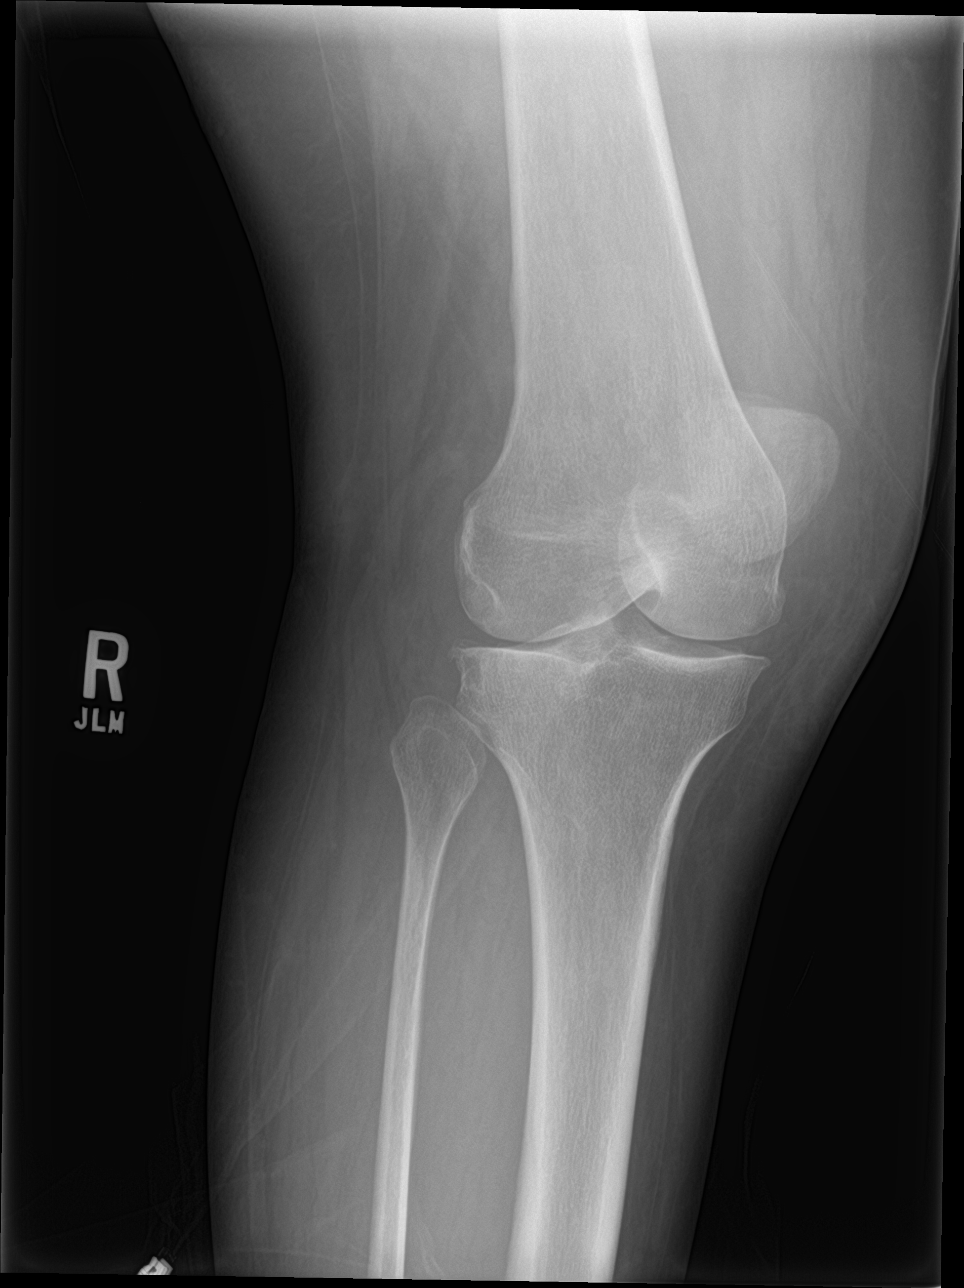

[knee obl (2 of 2)]
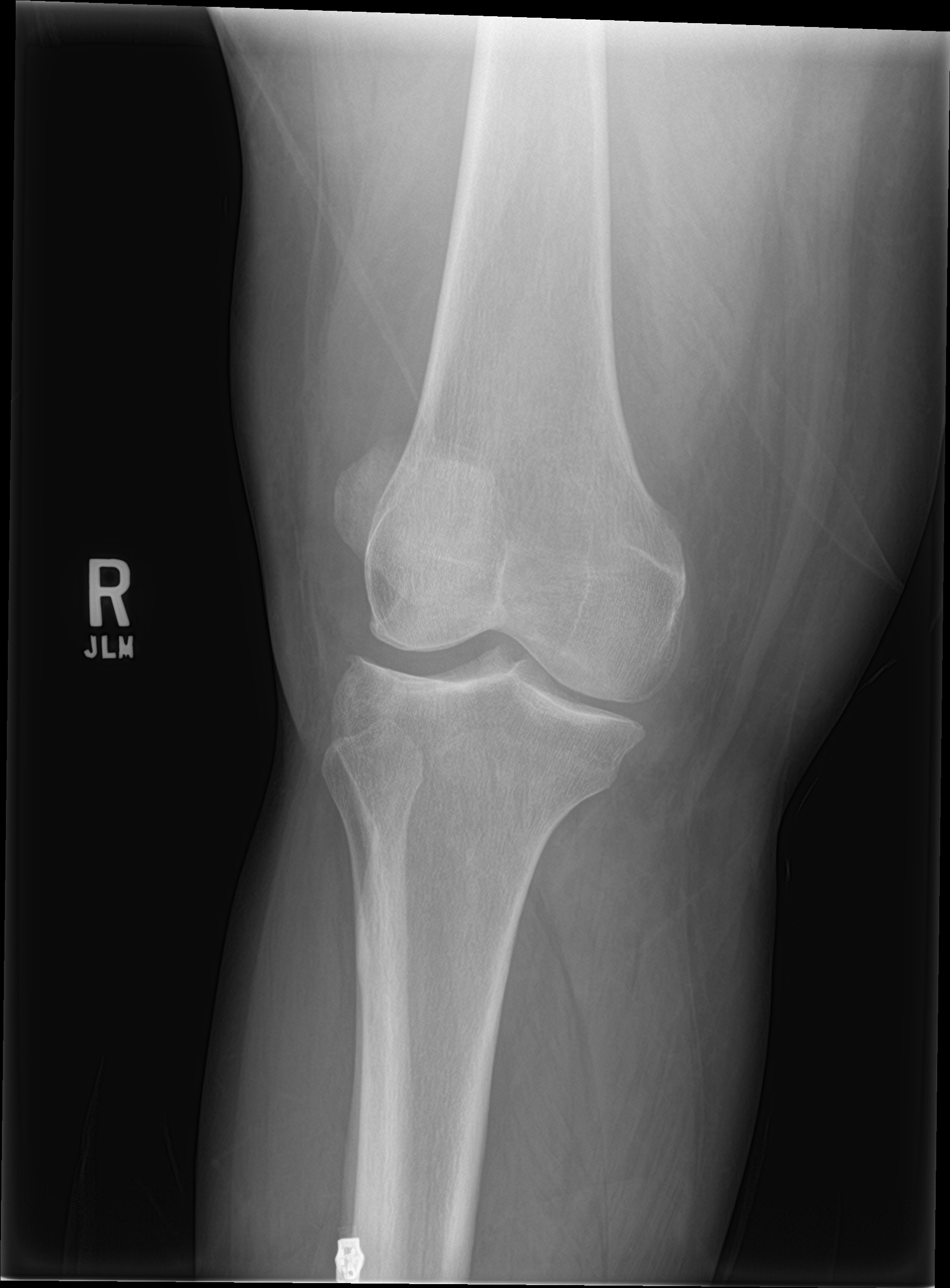

[knee lat]
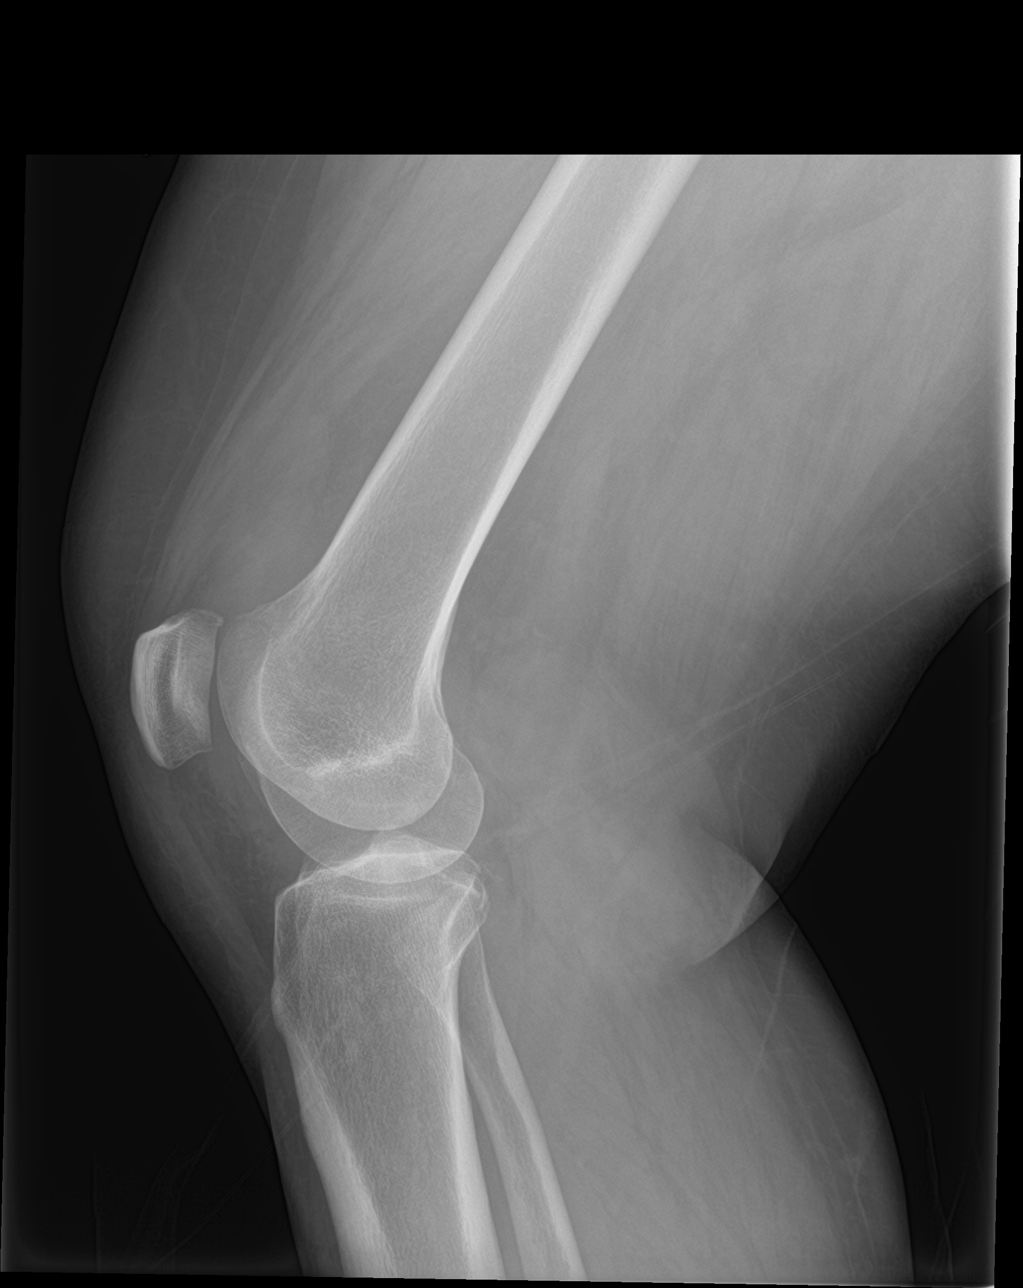

[4 of 4 positions shown; findings below may reference images not displayed]

FINDINGS: There is suggestion of cortical discontinuity of the posterior
tibial plateau on the lateral view. No definite tibial plateau
fracture is seen on the AP or oblique views. There is a moderate
knee joint effusion. There is a mild tricompartmental
osteoarthritis.
IMPRESSION: Possible cortical discontinuity seen on a single view in combination
with a moderate knee joint effusion is concerning for a tibial
plateau fracture.

## 2022-02-01 ENCOUNTER — Encounter: Payer: Self-pay | Admitting: Internal Medicine

## 2022-02-01 ENCOUNTER — Ambulatory Visit: Payer: 59 | Admitting: Internal Medicine

## 2022-02-01 VITALS — BP 142/92 | HR 70 | Temp 97.7°F | Ht 62.0 in | Wt 215.8 lb

## 2022-02-01 DIAGNOSIS — R635 Abnormal weight gain: Secondary | ICD-10-CM

## 2022-02-01 DIAGNOSIS — Z1211 Encounter for screening for malignant neoplasm of colon: Secondary | ICD-10-CM

## 2022-02-01 DIAGNOSIS — L2489 Irritant contact dermatitis due to other agents: Secondary | ICD-10-CM | POA: Diagnosis not present

## 2022-02-01 DIAGNOSIS — I10 Essential (primary) hypertension: Secondary | ICD-10-CM

## 2022-02-01 DIAGNOSIS — Z6839 Body mass index (BMI) 39.0-39.9, adult: Secondary | ICD-10-CM

## 2022-02-01 DIAGNOSIS — Z2821 Immunization not carried out because of patient refusal: Secondary | ICD-10-CM

## 2022-02-01 MED ORDER — SEMAGLUTIDE-WEIGHT MANAGEMENT 0.25 MG/0.5ML ~~LOC~~ SOAJ: 0.2500 mg | SUBCUTANEOUS | 0 refills | Status: DC

## 2022-02-01 MED ORDER — TRIAMCINOLONE ACETONIDE 0.1 % EX CREA: TOPICAL_CREAM | CUTANEOUS | 0 refills | Status: AC

## 2022-02-01 MED ORDER — AMLODIPINE BESYLATE 5 MG PO TABS: 5.0000 mg | ORAL_TABLET | Freq: Every day | ORAL | 1 refills | Status: DC

## 2022-02-01 NOTE — Progress Notes (Addendum)
Rich Brave Llittleton,acting as a Education administrator for Maximino Greenland, MD.,have documented all relevant documentation on the behalf of Maximino Greenland, MD,as directed by  Maximino Greenland, MD while in the presence of Maximino Greenland, MD.  This visit occurred during the SARS-CoV-2 public health emergency.  Safety protocols were in place, including screening questions prior to the visit, additional usage of staff PPE, and extensive cleaning of exam room while observing appropriate contact time as indicated for disinfecting solutions.  Subjective:     Patient ID: Loretta Norris , female    DOB: April 14, 1971 , 51 y.o.   MRN: [id number]   Chief Complaint  Patient presents with   Hypertension    HPI  Pt presents today for BPC. She reports compliance with meds. She denies headaches, chest pain and shortness of breath.  However, she does not think her BP is well controlled with current meds.   Patient reports she is having an ezcema flare up. She also would like to discuss starting a weight loss med.   Hypertension This is a chronic problem. The current episode started more than 1 year ago. The problem has been gradually improving since onset. The problem is controlled. Pertinent negatives include no blurred vision, chest pain, palpitations or shortness of breath. Risk factors for coronary artery disease include obesity, sedentary lifestyle and smoking/tobacco exposure. The current treatment provides moderate improvement. Compliance problems include exercise.     Past Medical History:  Diagnosis Date   Hypertension      Family History  Problem Relation Age of Onset   Asthma Mother    Hypertension Mother      Current Outpatient Medications:    amLODipine (NORVASC) 5 MG tablet, Take 1 tablet (5 mg total) by mouth daily., Disp: 90 tablet, Rfl: 1   clobetasol ointment (TEMOVATE) 5.18 %, Apply 1 application. topically 2 (two) times daily., Disp: 45 g, Rfl: 3   Cyanocobalamin (VITAMIN B-12 PO), Take 1  tablet by mouth daily., Disp: , Rfl:    cyclobenzaprine (FLEXERIL) 10 MG tablet, Take 1 tablet (10 mg total) by mouth 3 (three) times daily as needed for muscle spasms., Disp: 30 tablet, Rfl: 0   fexofenadine (ALLEGRA) 180 MG tablet, Take 1 tablet (180 mg total) by mouth daily., Disp: 30 tablet, Rfl: 2   Ginger, Zingiber officinalis, (GINGER PO), Take 1 capsule by mouth daily., Disp: , Rfl:    ibuprofen (ADVIL,MOTRIN) 200 MG tablet, Take 600 mg by mouth every 8 (eight) hours as needed for cramping (menstrual cramping)., Disp: , Rfl:    losartan-hydrochlorothiazide (HYZAAR) 100-25 MG tablet, Take 1 tablet by mouth daily., Disp: 90 tablet, Rfl: 1   OVER THE COUNTER MEDICATION, Take 8.6 mg by mouth daily. Vegetable Laxative, Disp: , Rfl:    Semaglutide-Weight Management 0.25 MG/0.5ML SOAJ, Inject 0.25 mg into the skin once a week for 28 days., Disp: 2 mL, Rfl: 0   triamcinolone cream (KENALOG) 0.1 %, APPLY TO AFFECTED AREA TWICE DAILY AS NEEDED, Disp: 45 g, Rfl: 0   valACYclovir (VALTREX) 500 MG tablet, Take 500 mg by mouth daily as needed (prevent flare ups). , Disp: , Rfl:    Allergies  Allergen Reactions   Allevess [Capsaicin-Menthol] Hives and Nausea And Vomiting   Naproxen Sodium Hives and Itching    Can tolerate Ibuprofen OKI     Review of Systems  Constitutional: Negative.   Eyes:  Negative for blurred vision.  Respiratory: Negative.  Negative for shortness of breath.   Cardiovascular:  Negative.  Negative for chest pain and palpitations.  Skin:  Positive for rash.       She c/o rash on elbows. Has h/o eczema. However, her sx started after wearing new uniform for work. She feels that the shirts are causing her sx.   Neurological: Negative.   Psychiatric/Behavioral: Negative.      Today's Vitals   02/01/22 0829  BP: (!) 142/100  Pulse: 70  Temp: 97.7 F (36.5 C)  Weight: 215 lb 12.8 oz (97.9 kg)  Height: _0  (1.575 m)  PainSc: 0-No pain   Body mass index is 39.47 kg/m.  Wt  Readings from Last 3 Encounters:  02/01/22 215 lb 12.8 oz (97.9 kg)  11/16/21 211 lb 12.8 oz (96.1 kg)  11/02/21 211 lb 3.2 oz (95.8 kg)     Objective:  Physical Exam Vitals and nursing note reviewed.  Constitutional:      Appearance: Normal appearance.  HENT:     Head: Normocephalic and atraumatic.  Eyes:     Extraocular Movements: Extraocular movements intact.  Cardiovascular:     Rate and Rhythm: Normal rate and regular rhythm.     Heart sounds: Normal heart sounds.  Pulmonary:     Effort: Pulmonary effort is normal.     Breath sounds: Normal breath sounds.  Musculoskeletal:     Cervical back: Normal range of motion.  Skin:    General: Skin is warm.     Comments: Maculopapular rash b/l arms at elbows, hyperpigmented on the right. Fine scales noted.   Neurological:     General: No focal deficit present.     Mental Status: She is alert.  Psychiatric:        Mood and Affect: Mood normal.        Behavior: Behavior normal.     Assessment And Plan:     1. Essential hypertension, benign Comments: Uncontrolled. Repeat BP 142/92. I will add amlodipine 5 nightly. She agrees to rto in 2 weeks for BP check.  - CMP14+EGFR - Amb Referral To Provider Referral Exercise Program (P.R.E.P)  2. Irritant contact dermatitis due to other agents Comments: I will send rx triamcinolone cream to apply to affected area BID prn.   3. Weight gain Comments: She has gained 4 lbs since March 2023. I will check labs as below. - Insulin, random(561) - Hemoglobin A1c  4. Class 2 severe obesity due to excess calories with serious comorbidity and body mass index (BMI) of 39.0 to 39.9 in adult [facility]) We discussed the use of Wegovy for obesity. She denies family/personal h/o thyroid cancer. I will send rx to her pharmacy to start PA process. She is reminded to stop eating when full. She will rto in 4 weeks for re-evaluation.  Possible side effects d/w patient.   - Amb Referral To Provider Referral  Exercise Program (P.R.E.P)  5. Special screening for malignant neoplasm of colon Comments: She agrees to Cologuard testing. She does not wish to have a colonoscopy.  - Cologuard  6. Herpes zoster vaccination declined   Patient was given opportunity to ask questions. Patient verbalized understanding of the plan and was able to repeat key elements of the plan. All questions were answered to their satisfaction.   I, Maximino Greenland, MD, have reviewed all documentation for this visit. The documentation on 02/01/22 for the exam, diagnosis, procedures, and orders are all accurate and complete.   IF YOU HAVE BEEN REFERRED TO A SPECIALIST, IT MAY TAKE 1-2 WEEKS TO SCHEDULE/PROCESS  THE REFERRAL. IF YOU HAVE NOT HEARD FROM US/SPECIALIST IN TWO WEEKS, PLEASE GIVE [place] A CALL AT [phone].   THE PATIENT IS ENCOURAGED TO PRACTICE SOCIAL DISTANCING DUE TO THE COVID-19 PANDEMIC.

## 2022-02-01 NOTE — Patient Instructions (Signed)
Hypertension, Adult ?Hypertension is another name for high blood pressure. High blood pressure forces your heart to work harder to pump blood. This can cause problems over time. ?There are two numbers in a blood pressure reading. There is a top number (systolic) over a bottom number (diastolic). It is best to have a blood pressure that is below 120/80. ?What are the causes? ?The cause of this condition is not known. Some other conditions can lead to high blood pressure. ?What increases the risk? ?Some lifestyle factors can make you more likely to develop high blood pressure: ?Smoking. ?Not getting enough exercise or physical activity. ?Being overweight. ?Having too much fat, sugar, calories, or salt (sodium) in your diet. ?Drinking too much alcohol. ?Other risk factors include: ?Having any of these conditions: ?Heart disease. ?Diabetes. ?High cholesterol. ?Kidney disease. ?Obstructive sleep apnea. ?Having a family history of high blood pressure and high cholesterol. ?Age. The risk increases with age. ?Stress. ?What are the signs or symptoms? ?High blood pressure may not cause symptoms. Very high blood pressure (hypertensive crisis) may cause: ?Headache. ?Fast or uneven heartbeats (palpitations). ?Shortness of breath. ?Nosebleed. ?Vomiting or feeling like you may vomit (nauseous). ?Changes in how you see. ?Very bad chest pain. ?Feeling dizzy. ?Seizures. ?How is this treated? ?This condition is treated by making healthy lifestyle changes, such as: ?Eating healthy foods. ?Exercising more. ?Drinking less alcohol. ?Your doctor may prescribe medicine if lifestyle changes do not help enough and if: ?Your top number is above 130. ?Your bottom number is above 80. ?Your personal target blood pressure may vary. ?Follow these instructions at home: ?Eating and drinking ? ?If told, follow the DASH eating plan. To follow this plan: ?Fill one half of your plate at each meal with fruits and vegetables. ?Fill one fourth of your plate  at each meal with whole grains. Whole grains include whole-wheat pasta, brown rice, and whole-grain bread. ?Eat or drink low-fat dairy products, such as skim milk or low-fat yogurt. ?Fill one fourth of your plate at each meal with low-fat (lean) proteins. Low-fat proteins include fish, chicken without skin, eggs, beans, and tofu. ?Avoid fatty meat, cured and processed meat, or chicken with skin. ?Avoid pre-made or processed food. ?Limit the amount of salt in your diet to less than 1,500 mg each day. ?Do not drink alcohol if: ?Your doctor tells you not to drink. ?You are pregnant, may be pregnant, or are planning to become pregnant. ?If you drink alcohol: ?Limit how much you have to: ?0-1 drink a day for women. ?0-2 drinks a day for men. ?Know how much alcohol is in your drink. In the [place] one drink equals one 12 oz bottle of beer (355 mL), one 5 oz glass of wine (148 mL), or one 1? oz glass of hard liquor (44 mL). ?Lifestyle ? ?Work with your doctor to stay at a healthy weight or to lose weight. Ask your doctor what the best weight is for you. ?Get at least 30 minutes of exercise that causes your heart to beat faster (aerobic exercise) most days of the week. This may include walking, swimming, or biking. ?Get at least 30 minutes of exercise that strengthens your muscles (resistance exercise) at least 3 days a week. This may include lifting weights or doing Pilates. ?Do not smoke or use any products that contain nicotine or tobacco. If you need help quitting, ask your doctor. ?Check your blood pressure at home as told by your doctor. ?Keep all follow-up visits. ?Medicines ?Take over-the-counter and prescription medicines   only as told by your doctor. Follow directions carefully. ?Do not skip doses of blood pressure medicine. The medicine does not work as well if you skip doses. Skipping doses also puts you at risk for problems. ?Ask your doctor about side effects or reactions to medicines that you should watch  for. ?Contact a doctor if: ?You think you are having a reaction to the medicine you are taking. ?You have headaches that keep coming back. ?You feel dizzy. ?You have swelling in your ankles. ?You have trouble with your vision. ?Get help right away if: ?You get a very bad headache. ?You start to feel mixed up (confused). ?You feel weak or numb. ?You feel faint. ?You have very bad pain in your: ?Chest. ?Belly (abdomen). ?You vomit more than once. ?You have trouble breathing. ?These symptoms may be an emergency. Get help right away. Call 911. ?Do not wait to see if the symptoms will go away. ?Do not drive yourself to the hospital. ?Summary ?Hypertension is another name for high blood pressure. ?High blood pressure forces your heart to work harder to pump blood. ?For most people, a normal blood pressure is less than 120/80. ?Making healthy choices can help lower blood pressure. If your blood pressure does not get lower with healthy choices, you may need to take medicine. ?This information is not intended to replace advice given to you by your health care provider. Make sure you discuss any questions you have with your health care provider. ?Document Revised: 06/10/2021 Document Reviewed: 06/10/2021 ?Elsevier Patient Education ? 2023 Elsevier Inc. ? ?

## 2022-02-01 NOTE — Addendum Note (Signed)
Addended by: Maximino Greenland on: 02/01/2022 09:08 AM   Modules accepted: Orders

## 2022-02-02 LAB — CMP14+EGFR
ALT: 13 IU/L (ref 0–32)
AST: 20 IU/L (ref 0–40)
Albumin/Globulin Ratio: 1.5 (ref 1.2–2.2)
Albumin: 4.4 g/dL (ref 3.8–4.9)
Alkaline Phosphatase: 84 IU/L (ref 44–121)
BUN/Creatinine Ratio: 15 (ref 9–23)
BUN: 11 mg/dL (ref 6–24)
Bilirubin Total: 0.5 mg/dL (ref 0.0–1.2)
CO2: 22 mmol/L (ref 20–29)
Calcium: 9.7 mg/dL (ref 8.7–10.2)
Chloride: 101 mmol/L (ref 96–106)
Creatinine, Ser: 0.73 mg/dL (ref 0.57–1.00)
Globulin, Total: 3 g/dL (ref 1.5–4.5)
Glucose: 97 mg/dL (ref 70–99)
Potassium: 4.2 mmol/L (ref 3.5–5.2)
Sodium: 140 mmol/L (ref 134–144)
Total Protein: 7.4 g/dL (ref 6.0–8.5)
eGFR: 99 mL/min/{1.73_m2} (ref >59)

## 2022-02-02 LAB — HEMOGLOBIN A1C
Est. average glucose Bld gHb Est-mCnc: 103 mg/dL
Hgb A1c MFr Bld: 5.2 % (ref 4.8–5.6)

## 2022-02-02 LAB — INSULIN, RANDOM: INSULIN: 3.9 u[IU]/mL (ref 2.6–24.9)

## 2022-02-04 ENCOUNTER — Telehealth: Payer: Self-pay

## 2022-02-04 ENCOUNTER — Encounter: Payer: Self-pay | Admitting: Internal Medicine

## 2022-02-04 NOTE — Telephone Encounter (Signed)
Called to discuss PREP program; wants to participate but works 8-5 so needs evening class; will have [place] contact her when next evening class begins at [facility].

## 2022-02-08 ENCOUNTER — Telehealth: Payer: Self-pay

## 2022-02-08 NOTE — Telephone Encounter (Signed)
[facility] pt requesting call back to discuss next PREP class starting 03/01/22 at [facility]

## 2022-02-10 ENCOUNTER — Telehealth: Payer: Self-pay

## 2022-02-10 NOTE — Telephone Encounter (Signed)
Call from pt earlier today confirming interest in [facility] starting on 03/01/22 Intake scheduled at [facility] for 02/22/22 at 715pm

## 2022-02-15 ENCOUNTER — Ambulatory Visit: Payer: 59

## 2022-02-15 VITALS — BP 138/82 | HR 72 | Temp 98.4°F

## 2022-02-15 DIAGNOSIS — I10 Essential (primary) hypertension: Secondary | ICD-10-CM

## 2022-02-15 MED ORDER — VALSARTAN-HYDROCHLOROTHIAZIDE 160-25 MG PO TABS: 1.0000 | ORAL_TABLET | Freq: Every day | ORAL | 1 refills | Status: DC

## 2022-02-15 NOTE — Patient Instructions (Signed)

## 2022-02-15 NOTE — Progress Notes (Signed)
Patient presents today for BPC. She is currently taking amlodipine '5mg'$  and  losartan-hctz 100-'25mg'$ .  BP Readings from Last 3 Encounters:  02/15/22 138/82  02/01/22 (!) 142/92  11/16/21 116/82   Patient advised to increase water intake and decrease sodium intake. Provider would like to change losartan/hctz to valsartan/hctz when she runs out. Patient will follow up with provider in July.

## 2022-02-22 NOTE — Progress Notes (Signed)
[facility] PREP Evaluation  Patient Details  Name: Loretta Norris MRN: [id number] Date of Birth: 1971-06-07 Age: 51 y.o. PCP: Minette Brine, FNP  Vitals:   02/22/22 1815  BP: 126/80  Pulse: 78  SpO2: 98%  Weight: 211 lb 12.8 oz (96.1 kg)     [facility] Eval - 02/22/22 1900       [facility] "PREP" Location   [facility] "PREP" Location [facility]      Referral    Referring Provider [facility]    Reason for referral Hypertension    Program Start Date 03/01/22   T/TH 6p-715pm x 12 wks     Measurement   Waist Circumference 40.5 inches    Hip Circumference 46.5 inches    Body fat 47.8 percent      Information for Trainer   Goals Weight loss 20 lbs    Current Exercise Walks, stair climbing at work    Orthopedic Concerns Knee pain, LBP, right ankle pain    Pertinent Medical History HTN    Current Barriers none    Medications that affect exercise Medication causing dizziness/drowsiness      Timed Up and Go (TUGS)   Timed Up and Go Low risk <9 seconds      Mobility and Daily Activities   I find it easy to walk up or down two or more flights of stairs. 4    I have no trouble taking out the trash. 4    I do housework such as vacuuming and dusting on my own without difficulty. 4    I can easily lift a gallon of milk (8lbs). 4    I can easily walk a mile. 4    I have no trouble reaching into high cupboards or reaching down to pick up something from the floor. 3    I do not have trouble doing out-door work such as Armed forces logistics/support/administrative officer, raking leaves, or gardening. 1      Mobility and Daily Activities   I feel younger than my age. 2    I feel independent. 3    I feel energetic. 2    I live an active life.  2    I feel strong. 3    I feel healthy. 2    I feel active as other people my age. 2      How fit and strong are you.   Fit and Strong Total Score 40            Past Medical History:  Diagnosis Date   Hypertension    Past Surgical History:  Procedure Laterality Date   DILATION AND  CURETTAGE OF UTERUS     DILITATION & CURRETTAGE/HYSTROSCOPY WITH THERMACHOICE ABLATION N/A 04/17/2014   Procedure: DILATATION & CURETTAGE/HYSTEROSCOPY WITH THERMACHOICE ABLATION;  Surgeon: Betsy Coder, MD;  Location: [facility];  Service: Gynecology;  Laterality: N/A;   LEG SURGERY     "removal of cyst"   TUBAL LIGATION     Social History   Tobacco Use  Smoking Status Every Day   Packs/day: 0.25   Years: 15.00   Total pack years: 3.75   Types: Cigarettes  Smokeless Tobacco Never  Tobacco Comments   try to cut back on number of cigs/day    Barnett Hatter 02/22/2022, 7:35 PM

## 2022-02-28 ENCOUNTER — Encounter: Payer: Self-pay | Admitting: Internal Medicine

## 2022-02-28 ENCOUNTER — Other Ambulatory Visit: Payer: Self-pay

## 2022-02-28 MED ORDER — SEMAGLUTIDE-WEIGHT MANAGEMENT 0.25 MG/0.5ML ~~LOC~~ SOAJ: 0.2500 mg | SUBCUTANEOUS | 0 refills | Status: AC

## 2022-03-03 NOTE — Progress Notes (Signed)
[facility] PREP Weekly Session  Patient Details  Name: Loretta Norris MRN: [id number] Date of Birth: [birth year] Age: 51 y.o. PCP: Minette Brine, FNP  Vitals:   03/01/22 1800  Weight: 211 lb (95.7 kg)     [facility] Weekly seesion - 03/03/22 1600       [facility] "PREP" Location   [facility] "PREP" Location [facility]      Weekly Session   Topic Discussed Goal setting and welcome to the program   Scale of perceived exertion, target heart rate, fit testing   Classes attended to date [name] 03/03/2022, 4:09 PM

## 2022-03-15 ENCOUNTER — Encounter: Payer: Self-pay | Admitting: Internal Medicine

## 2022-03-15 ENCOUNTER — Ambulatory Visit: Payer: 59 | Admitting: Internal Medicine

## 2022-03-15 VITALS — BP 158/112 | HR 74 | Temp 98.1°F | Ht 61.6 in | Wt 214.6 lb

## 2022-03-15 DIAGNOSIS — Z6839 Body mass index (BMI) 39.0-39.9, adult: Secondary | ICD-10-CM | POA: Diagnosis not present

## 2022-03-15 DIAGNOSIS — R0683 Snoring: Secondary | ICD-10-CM | POA: Diagnosis not present

## 2022-03-15 DIAGNOSIS — I10 Essential (primary) hypertension: Secondary | ICD-10-CM | POA: Diagnosis not present

## 2022-03-15 NOTE — Progress Notes (Signed)
Rich Brave Llittleton,acting as a Education administrator for Maximino Greenland, MD.,have documented all relevant documentation on the behalf of Maximino Greenland, MD,as directed by  Maximino Greenland, MD while in the presence of Maximino Greenland, MD.    Subjective:     Patient ID: Loretta Norris , female    DOB: [birth year] , 51 y.o.   MRN: [id number]   Chief Complaint  Patient presents with   Hypertension    HPI  Pt presents today for BPC. She reports compliance with meds. She denies headaches, chest pain and shortness of breath.  Patient stated she has been unable to get the [facility] due to backorder, so she would like discuss trying something else for weight loss.    Hypertension This is a chronic problem. The current episode started more than 1 year ago. The problem has been gradually improving since onset. The problem is controlled. Pertinent negatives include no blurred vision. Risk factors for coronary artery disease include obesity, sedentary lifestyle and smoking/tobacco exposure. The current treatment provides moderate improvement. Compliance problems include exercise.      Past Medical History:  Diagnosis Date   Hypertension      Family History  Problem Relation Age of Onset   Asthma Mother    Hypertension Mother      Current Outpatient Medications:    clobetasol ointment (TEMOVATE) 3.89 %, Apply 1 application. topically 2 (two) times daily., Disp: 45 g, Rfl: 3   Cyanocobalamin (VITAMIN B-12 PO), Take 1 tablet by mouth daily., Disp: , Rfl:    fexofenadine (ALLEGRA) 180 MG tablet, Take 1 tablet (180 mg total) by mouth daily., Disp: 30 tablet, Rfl: 2   Ginger, Zingiber officinalis, (GINGER PO), Take 1 capsule by mouth daily., Disp: , Rfl:    ibuprofen (ADVIL,MOTRIN) 200 MG tablet, Take 600 mg by mouth every 8 (eight) hours as needed for cramping (menstrual cramping)., Disp: , Rfl:    OVER THE COUNTER MEDICATION, Take 8.6 mg by mouth daily. Vegetable Laxative, Disp: , Rfl:    triamcinolone cream  (KENALOG) 0.1 %, APPLY TO AFFECTED AREA TWICE DAILY AS NEEDED, Disp: 45 g, Rfl: 0   valACYclovir (VALTREX) 500 MG tablet, Take 500 mg by mouth daily as needed (prevent flare ups). , Disp: , Rfl:    valsartan-hydrochlorothiazide (DIOVAN HCT) 160-25 MG tablet, Take 1 tablet by mouth daily., Disp: 30 tablet, Rfl: 1   Semaglutide-Weight Management 0.25 MG/0.5ML SOAJ, Inject 0.25 mg into the skin once a week for 28 days. (Patient not taking: Reported on 03/15/2022), Disp: 2 mL, Rfl: 0   Allergies  Allergen Reactions   Allevess [Capsaicin-Menthol] Hives and Nausea And Vomiting   Naproxen Sodium Hives and Itching    Can tolerate Ibuprofen OKI     Review of Systems  Constitutional: Negative.   Eyes:  Negative for blurred vision.  Respiratory: Negative.    Cardiovascular: Negative.   Neurological: Negative.   Psychiatric/Behavioral: Negative.       Today's Vitals   03/15/22 0854  BP: (!) 150/110  Pulse: 74  Temp: 98.1 F (36.7 C)  Weight: 214 lb 9.6 oz (97.3 kg)  Height: 5' 1.6" (1.565 m)  PainSc: 0-No pain   Body mass index is 39.76 kg/m.  Wt Readings from Last 3 Encounters:  03/15/22 214 lb 9.6 oz (97.3 kg)  03/01/22 211 lb (95.7 kg)  02/22/22 211 lb 12.8 oz (96.1 kg)     Objective:  Physical Exam Vitals and nursing note reviewed.  Constitutional:  Appearance: Normal appearance. She is obese.  HENT:     Head: Normocephalic and atraumatic.  Eyes:     Extraocular Movements: Extraocular movements intact.  Cardiovascular:     Rate and Rhythm: Normal rate and regular rhythm.     Heart sounds: Normal heart sounds.  Pulmonary:     Effort: Pulmonary effort is normal.     Breath sounds: Normal breath sounds.  Musculoskeletal:     Cervical back: Normal range of motion.  Skin:    General: Skin is warm.  Neurological:     General: No focal deficit present.     Mental Status: She is alert.  Psychiatric:        Mood and Affect: Mood normal.        Behavior: Behavior  normal.       Assessment And Plan:     1. Essential hypertension, benign Comments: She will c/w valsartan/hctz in am. She will resume amlodipine at night. We also reviewed her cardiac risk factors, advised to take HTN seriously. F/u 2 weeks.  - BMP8+EGFR  2. Snoring Comments: She also reports daytime somnolence, fatigue and nocturia. She was also advised that OSA could negatively impact her BP if untreated. - Ambulatory referral to Neurology  3. Class 2 severe obesity due to excess calories with serious comorbidity and body mass index (BMI) of 39.0 to 39.9 in adult [facility]) Comments: I will check on Wegovy PA, I hope to start her on this within two weeks.    Patient was given opportunity to ask questions. Patient verbalized understanding of the plan and was able to repeat key elements of the plan. All questions were answered to their satisfaction.   I, Maximino Greenland, MD, have reviewed all documentation for this visit. The documentation on 03/15/22 for the exam, diagnosis, procedures, and orders are all accurate and complete.   IF YOU HAVE BEEN REFERRED TO A SPECIALIST, IT MAY TAKE 1-2 WEEKS TO SCHEDULE/PROCESS THE REFERRAL. IF YOU HAVE NOT HEARD FROM US/SPECIALIST IN TWO WEEKS, PLEASE GIVE [place] A CALL AT [phone].   THE PATIENT IS ENCOURAGED TO PRACTICE SOCIAL DISTANCING DUE TO THE COVID-19 PANDEMIC.

## 2022-03-15 NOTE — Patient Instructions (Signed)
Hypertension, Adult ?Hypertension is another name for high blood pressure. High blood pressure forces your heart to work harder to pump blood. This can cause problems over time. ?There are two numbers in a blood pressure reading. There is a top number (systolic) over a bottom number (diastolic). It is best to have a blood pressure that is below 120/80. ?What are the causes? ?The cause of this condition is not known. Some other conditions can lead to high blood pressure. ?What increases the risk? ?Some lifestyle factors can make you more likely to develop high blood pressure: ?Smoking. ?Not getting enough exercise or physical activity. ?Being overweight. ?Having too much fat, sugar, calories, or salt (sodium) in your diet. ?Drinking too much alcohol. ?Other risk factors include: ?Having any of these conditions: ?Heart disease. ?Diabetes. ?High cholesterol. ?Kidney disease. ?Obstructive sleep apnea. ?Having a family history of high blood pressure and high cholesterol. ?Age. The risk increases with age. ?Stress. ?What are the signs or symptoms? ?High blood pressure may not cause symptoms. Very high blood pressure (hypertensive crisis) may cause: ?Headache. ?Fast or uneven heartbeats (palpitations). ?Shortness of breath. ?Nosebleed. ?Vomiting or feeling like you may vomit (nauseous). ?Changes in how you see. ?Very bad chest pain. ?Feeling dizzy. ?Seizures. ?How is this treated? ?This condition is treated by making healthy lifestyle changes, such as: ?Eating healthy foods. ?Exercising more. ?Drinking less alcohol. ?Your doctor may prescribe medicine if lifestyle changes do not help enough and if: ?Your top number is above 130. ?Your bottom number is above 80. ?Your personal target blood pressure may vary. ?Follow these instructions at home: ?Eating and drinking ? ?If told, follow the DASH eating plan. To follow this plan: ?Fill one half of your plate at each meal with fruits and vegetables. ?Fill one fourth of your plate  at each meal with whole grains. Whole grains include whole-wheat pasta, brown rice, and whole-grain bread. ?Eat or drink low-fat dairy products, such as skim milk or low-fat yogurt. ?Fill one fourth of your plate at each meal with low-fat (lean) proteins. Low-fat proteins include fish, chicken without skin, eggs, beans, and tofu. ?Avoid fatty meat, cured and processed meat, or chicken with skin. ?Avoid pre-made or processed food. ?Limit the amount of salt in your diet to less than 1,500 mg each day. ?Do not drink alcohol if: ?Your doctor tells you not to drink. ?You are pregnant, may be pregnant, or are planning to become pregnant. ?If you drink alcohol: ?Limit how much you have to: ?0-1 drink a day for women. ?0-2 drinks a day for men. ?Know how much alcohol is in your drink. In the [place] one drink equals one 12 oz bottle of beer (355 mL), one 5 oz glass of wine (148 mL), or one 1? oz glass of hard liquor (44 mL). ?Lifestyle ? ?Work with your doctor to stay at a healthy weight or to lose weight. Ask your doctor what the best weight is for you. ?Get at least 30 minutes of exercise that causes your heart to beat faster (aerobic exercise) most days of the week. This may include walking, swimming, or biking. ?Get at least 30 minutes of exercise that strengthens your muscles (resistance exercise) at least 3 days a week. This may include lifting weights or doing Pilates. ?Do not smoke or use any products that contain nicotine or tobacco. If you need help quitting, ask your doctor. ?Check your blood pressure at home as told by your doctor. ?Keep all follow-up visits. ?Medicines ?Take over-the-counter and prescription medicines   only as told by your doctor. Follow directions carefully. ?Do not skip doses of blood pressure medicine. The medicine does not work as well if you skip doses. Skipping doses also puts you at risk for problems. ?Ask your doctor about side effects or reactions to medicines that you should watch  for. ?Contact a doctor if: ?You think you are having a reaction to the medicine you are taking. ?You have headaches that keep coming back. ?You feel dizzy. ?You have swelling in your ankles. ?You have trouble with your vision. ?Get help right away if: ?You get a very bad headache. ?You start to feel mixed up (confused). ?You feel weak or numb. ?You feel faint. ?You have very bad pain in your: ?Chest. ?Belly (abdomen). ?You vomit more than once. ?You have trouble breathing. ?These symptoms may be an emergency. Get help right away. Call 911. ?Do not wait to see if the symptoms will go away. ?Do not drive yourself to the hospital. ?Summary ?Hypertension is another name for high blood pressure. ?High blood pressure forces your heart to work harder to pump blood. ?For most people, a normal blood pressure is less than 120/80. ?Making healthy choices can help lower blood pressure. If your blood pressure does not get lower with healthy choices, you may need to take medicine. ?This information is not intended to replace advice given to you by your health care provider. Make sure you discuss any questions you have with your health care provider. ?Document Revised: 06/10/2021 Document Reviewed: 06/10/2021 ?Elsevier Patient Education ? 2023 Elsevier Inc. ? ?

## 2022-03-16 LAB — BMP8+EGFR
BUN/Creatinine Ratio: 23 (ref 9–23)
BUN: 15 mg/dL (ref 6–24)
CO2: 24 mmol/L (ref 20–29)
Calcium: 10 mg/dL (ref 8.7–10.2)
Chloride: 102 mmol/L (ref 96–106)
Creatinine, Ser: 0.66 mg/dL (ref 0.57–1.00)
Glucose: 95 mg/dL (ref 70–99)
Potassium: 4.1 mmol/L (ref 3.5–5.2)
Sodium: 143 mmol/L (ref 134–144)
eGFR: 105 mL/min/{1.73_m2} (ref >59)

## 2022-03-17 NOTE — Progress Notes (Signed)
[facility] PREP Weekly Session  Patient Details  Name: Loretta Norris MRN: [id number] Date of Birth: 12/09/1970 Age: 51 y.o. PCP: Minette Brine, FNP  Vitals:   03/15/22 1800  Weight: 211 lb (95.7 kg)     [facility] Weekly seesion - 03/17/22 1600       [facility] "PREP" Location   [facility] "PREP" Location [facility]      Weekly Session   Topic Discussed Importance of resistance training;Other ways to be active    Minutes exercised this week 25 minutes    Classes attended to date [place] 03/17/2022, 4:36 PM

## 2022-03-24 NOTE — Progress Notes (Signed)
[facility] PREP Weekly Session  Patient Details  Name: Loretta Norris MRN: [id number] Date of Birth: 1971-04-21 Age: 51 y.o. PCP: Minette Brine, FNP  Vitals:   03/22/22 1800  Weight: 211 lb (95.7 kg)     [facility] Weekly seesion - 03/24/22 1000       [facility] "PREP" Location   [facility] "PREP" Location [facility]      Weekly Session   Topic Discussed Healthy eating tips    Minutes exercised this week 150 minutes    Classes attended to date [place] 03/24/2022, 10:43 AM

## 2022-03-29 ENCOUNTER — Ambulatory Visit: Payer: 59

## 2022-03-29 VITALS — BP 144/98 | HR 71 | Temp 98.3°F

## 2022-03-29 DIAGNOSIS — I10 Essential (primary) hypertension: Secondary | ICD-10-CM

## 2022-03-29 MED ORDER — AMLODIPINE BESYLATE 10 MG PO TABS: 10.0000 mg | ORAL_TABLET | Freq: Every day | ORAL | 1 refills | Status: DC

## 2022-03-29 NOTE — Progress Notes (Signed)
Patient presents today for BPC. She is currently taking Valsartan-hctz 160-'25mg'$  and Amlodipine '5mg'$ .   BP Readings from Last 3 Encounters:  03/29/22 (!) 144/98  03/15/22 (!) 158/112  02/22/22 126/80   Patient reports compliance with medication. Provider would like increase Amlodipine to '10mg'$ . She will follow up with provider in 6 weeks. Patient advised to increase water intake and decrease salt intake.

## 2022-04-01 NOTE — Progress Notes (Signed)
[facility] PREP Weekly Session  Patient Details  Name: Loretta Norris MRN: [id number] Date of Birth: May 06, 1971 Age: 51 y.o. PCP: Minette Brine, FNP  Vitals:   03/29/22 1800  Weight: 213 lb (96.6 kg)     [facility] Weekly seesion - 04/01/22 1100       [facility] "PREP" Location   [facility] "PREP" [facility]      Weekly Session   Topic Discussed Health habits    Minutes exercised this week 210 minutes    Classes attended to date [place] 04/01/2022, 11:33 AM

## 2022-04-06 NOTE — Progress Notes (Signed)
[facility] PREP Weekly Session  Patient Details  Name: Loretta Norris MRN: [id number] Date of Birth: Jun 05, 1971 Age: 51 y.o. PCP: Minette Brine, FNP  Vitals:   04/05/22 1800  Weight: 213 lb (96.6 kg)     [facility] Weekly seesion - 04/06/22 0900       [facility] "PREP" Location   [facility] "PREP" [facility]      Weekly Session   Topic Discussed Restaurant Eating    Minutes exercised this week 45 minutes    Classes attended to date [place] 04/06/2022, 9:32 AM

## 2022-04-15 NOTE — Progress Notes (Signed)
[facility] PREP Weekly Session  Patient Details  Name: Loretta Norris MRN: [id number] Date of Birth: December 21, 1970 Age: 51 y.o. PCP: Minette Brine, FNP  Vitals:   04/12/22 1800  Weight: 214 lb (97.1 kg)     [facility] Weekly seesion - 04/15/22 1700       [facility] "PREP" Location   [facility] "PREP" [facility]      Weekly Session   Topic Discussed Stress management and problem solving   progressive relaxation exericse, chair yoga   Minutes exercised this week 330 minutes    Classes attended to date 12             Barnett Hatter 04/15/2022, 5:30 PM

## 2022-04-27 NOTE — Progress Notes (Signed)
[facility] PREP Weekly Session  Patient Details  Name: Loretta Norris MRN: [id number] Date of Birth: [birth year] Age: 51 y.o. PCP: Minette Brine, FNP  Vitals:   04/26/22 1800  Weight: 212 lb (96.2 kg)     [facility] Weekly seesion - 04/27/22 1000       [facility] "PREP" Location   [facility] "PREP" Location [facility]      Weekly Session   Topic Discussed Expectations and non-scale victories    Minutes exercised this week 310 minutes    Classes attended to date [place] 04/27/2022, 10:08 AM

## 2022-05-02 ENCOUNTER — Other Ambulatory Visit: Payer: Self-pay | Admitting: Internal Medicine

## 2022-05-04 NOTE — Progress Notes (Signed)
[facility] PREP Weekly Session  Patient Details  Name: Loretta Norris MRN: [id number] Date of Birth: 08/06/1971 Age: 51 y.o. PCP: Minette Brine, FNP  Vitals:   05/03/22 1800  Weight: 212 lb (96.2 kg)     [facility] Weekly seesion - 05/04/22 1100       [facility] "PREP" Location   [facility] "PREP" Location [facility]      Weekly Session   Topic Discussed --   Portions and label reading   Minutes exercised this week 365 minutes    Classes attended to date [place] 05/04/2022, 11:54 AM

## 2022-05-12 NOTE — Progress Notes (Signed)
[facility] PREP Weekly Session  Patient Details  Name: Loretta Norris MRN: [id number] Date of Birth: Jul 30, 1971 Age: 51 y.o. PCP: Minette Brine, FNP  Vitals:   05/11/22 1800  Weight: 215 lb (97.5 kg)     [facility] Weekly seesion - 05/12/22 1600       [facility] "PREP" Location   [facility] "PREP" Location [facility]      Weekly Session   Topic Discussed Finding support    Minutes exercised this week 330 minutes    Classes attended to date 17             Barnett Hatter 05/12/2022, 4:26 PM

## 2022-05-16 ENCOUNTER — Ambulatory Visit: Payer: 59 | Admitting: Internal Medicine

## 2022-05-18 NOTE — Progress Notes (Signed)
[facility] PREP Weekly Session  Patient Details  Name: Loretta Norris MRN: [id number] Date of Birth: 1971/07/27 Age: 51 y.o. PCP: Minette Brine, FNP  Vitals:   05/17/22 1800  Weight: 214 lb (97.1 kg)     [facility] Weekly seesion - 05/18/22 1000       [facility] "PREP" Location   [facility] "PREP" Location [facility]      Weekly Session   Topic Discussed Calorie breakdown    Minutes exercised this week 370 minutes    Classes attended to date 53             [employer] 05/18/2022, 10:32 AM

## 2022-05-19 ENCOUNTER — Encounter: Payer: Self-pay | Admitting: Internal Medicine

## 2022-05-19 ENCOUNTER — Ambulatory Visit: Payer: 59 | Admitting: Internal Medicine

## 2022-05-19 VITALS — BP 132/86 | HR 76 | Temp 98.5°F | Ht 61.4 in | Wt 217.8 lb

## 2022-05-19 DIAGNOSIS — Z6841 Body Mass Index (BMI) 40.0 and over, adult: Secondary | ICD-10-CM | POA: Diagnosis not present

## 2022-05-19 DIAGNOSIS — Z23 Encounter for immunization: Secondary | ICD-10-CM | POA: Diagnosis not present

## 2022-05-19 DIAGNOSIS — I10 Essential (primary) hypertension: Secondary | ICD-10-CM | POA: Diagnosis not present

## 2022-05-19 MED ORDER — AMLODIPINE BESYLATE 10 MG PO TABS: 10.0000 mg | ORAL_TABLET | Freq: Every day | ORAL | 1 refills | Status: AC

## 2022-05-19 NOTE — Progress Notes (Signed)
Rich Brave Llittleton,acting as a Education administrator for Maximino Greenland, MD.,have documented all relevant documentation on the behalf of Maximino Greenland, MD,as directed by  Maximino Greenland, MD while in the presence of Maximino Greenland, MD.    Subjective:     Patient ID: Loretta Norris , female    DOB: 03/16/1971 , 51 y.o.   MRN: [id number]   Chief Complaint  Patient presents with   Hypertension    HPI  Pt presents today for BPC. She reports compliance with meds. She denies headaches, chest pain and shortness of breath.  She admits she is not yet exercising as much as she should.   BP Readings from Last 3 Encounters: 05/19/22 : (!) 134/90 03/29/22 : (!) 144/98 03/15/22 : (!) 158/112    Hypertension This is a chronic problem. The current episode started more than 1 year ago. The problem has been gradually improving since onset. The problem is controlled. Pertinent negatives include no blurred vision. Risk factors for coronary artery disease include obesity, sedentary lifestyle and smoking/tobacco exposure. The current treatment provides moderate improvement. Compliance problems include exercise.      Past Medical History:  Diagnosis Date   Hypertension      Family History  Problem Relation Age of Onset   Asthma Mother    Hypertension Mother      Current Outpatient Medications:    clobetasol ointment (TEMOVATE) 2.70 %, Apply 1 application. topically 2 (two) times daily., Disp: 45 g, Rfl: 3   Cyanocobalamin (VITAMIN B-12 PO), Take 1 tablet by mouth daily., Disp: , Rfl:    Ginger, Zingiber officinalis, (GINGER PO), Take 1 capsule by mouth daily., Disp: , Rfl:    ibuprofen (ADVIL,MOTRIN) 200 MG tablet, Take 600 mg by mouth every 8 (eight) hours as needed for cramping (menstrual cramping)., Disp: , Rfl:    triamcinolone cream (KENALOG) 0.1 %, APPLY TO AFFECTED AREA TWICE DAILY AS NEEDED, Disp: 45 g, Rfl: 0   valACYclovir (VALTREX) 500 MG tablet, Take 500 mg by mouth daily as needed (prevent  flare ups). , Disp: , Rfl:    valsartan-hydrochlorothiazide (DIOVAN-HCT) 160-25 MG tablet, Take 1 tablet by mouth once daily, Disp: 90 tablet, Rfl: 0   amLODipine (NORVASC) 10 MG tablet, Take 1 tablet (10 mg total) by mouth daily., Disp: 90 tablet, Rfl: 1   EQ ALLERGY RELIEF 180 MG tablet, Take 1 tablet by mouth once daily, Disp: 30 tablet, Rfl: 0   OVER THE COUNTER MEDICATION, Take 8.6 mg by mouth daily. Vegetable Laxative (Patient not taking: Reported on 05/19/2022), Disp: , Rfl:    Allergies  Allergen Reactions   Allevess [Capsaicin-Menthol] Hives and Nausea And Vomiting   Naproxen Sodium Hives and Itching    Can tolerate Ibuprofen OKI     Review of Systems  Constitutional: Negative.   Eyes:  Negative for blurred vision.  Respiratory: Negative.    Cardiovascular: Negative.   Gastrointestinal: Negative.   Neurological: Negative.   Psychiatric/Behavioral: Negative.       Today's Vitals   05/19/22 1627 05/19/22 1655  BP: (!) 134/90 132/86  Pulse: 76   Temp: 98.5 F (36.9 C)   Weight: 217 lb 12.8 oz (98.8 kg)   Height: 5' 1.4" (1.56 m)   PainSc: 0-No pain    Body mass index is 40.62 kg/m.  Wt Readings from Last 3 Encounters:  05/19/22 217 lb 12.8 oz (98.8 kg)  05/17/22 214 lb (97.1 kg)  05/11/22 215 lb (97.5 kg)  Objective:  Physical Exam Vitals and nursing note reviewed.  Constitutional:      Appearance: Normal appearance. She is obese.  HENT:     Head: Normocephalic and atraumatic.  Eyes:     Extraocular Movements: Extraocular movements intact.  Cardiovascular:     Rate and Rhythm: Normal rate and regular rhythm.     Heart sounds: Normal heart sounds.  Pulmonary:     Effort: Pulmonary effort is normal.     Breath sounds: Normal breath sounds.  Musculoskeletal:     Cervical back: Normal range of motion.  Skin:    General: Skin is warm.  Neurological:     General: No focal deficit present.     Mental Status: She is alert.  Psychiatric:        Mood and  Affect: Mood normal.        Behavior: Behavior normal.      Assessment And Plan:     1. Essential hypertension, benign Comments: Chronic, fair control. Goal BP<130/80. Advised to follow low sodium diet. She will c/w valsartan/hct 160/25 and amlodipine '10mg'$  daily. F/u 3 months.   2. Class 3 severe obesity due to excess calories without serious comorbidity with body mass index (BMI) of 40.0 to 44.9 in adult [facility]) Comments: She is encouraged to aim for at least 150 minutes of exercise per week, while striving for BMI<30 to decrease cardiac risk.   3. Immunization due - Flu Vaccine QUAD 6+ mos PF IM (Fluarix Quad PF)   Patient was given opportunity to ask questions. Patient verbalized understanding of the plan and was able to repeat key elements of the plan. All questions were answered to their satisfaction.   I, Maximino Greenland, MD, have reviewed all documentation for this visit. The documentation on 05/19/22 for the exam, diagnosis, procedures, and orders are all accurate and complete.   IF YOU HAVE BEEN REFERRED TO A SPECIALIST, IT MAY TAKE 1-2 WEEKS TO SCHEDULE/PROCESS THE REFERRAL. IF YOU HAVE NOT HEARD FROM US/SPECIALIST IN TWO WEEKS, PLEASE GIVE [place] A CALL AT [phone].   THE PATIENT IS ENCOURAGED TO PRACTICE SOCIAL DISTANCING DUE TO THE COVID-19 PANDEMIC.

## 2022-05-19 NOTE — Patient Instructions (Addendum)
Start taking Valsartan/hctz in AM Amlodipine in PM  This gives you better 24 hour coverage  Hypertension, Adult Hypertension is another name for high blood pressure. High blood pressure forces your heart to work harder to pump blood. This can cause problems over time. There are two numbers in a blood pressure reading. There is a top number (systolic) over a bottom number (diastolic). It is best to have a blood pressure that is below 120/80. What are the causes? The cause of this condition is not known. Some other conditions can lead to high blood pressure. What increases the risk? Some lifestyle factors can make you more likely to develop high blood pressure: Smoking. Not getting enough exercise or physical activity. Being overweight. Having too much fat, sugar, calories, or salt (sodium) in your diet. Drinking too much alcohol. Other risk factors include: Having any of these conditions: Heart disease. Diabetes. High cholesterol. Kidney disease. Obstructive sleep apnea. Having a family history of high blood pressure and high cholesterol. Age. The risk increases with age. Stress. What are the signs or symptoms? High blood pressure may not cause symptoms. Very high blood pressure (hypertensive crisis) may cause: Headache. Fast or uneven heartbeats (palpitations). Shortness of breath. Nosebleed. Vomiting or feeling like you may vomit (nauseous). Changes in how you see. Very bad chest pain. Feeling dizzy. Seizures. How is this treated? This condition is treated by making healthy lifestyle changes, such as: Eating healthy foods. Exercising more. Drinking less alcohol. Your doctor may prescribe medicine if lifestyle changes do not help enough and if: Your top number is above 130. Your bottom number is above 80. Your personal target blood pressure may vary. Follow these instructions at home: Eating and drinking  If told, follow the DASH eating plan. To follow this  plan: Fill one half of your plate at each meal with fruits and vegetables. Fill one fourth of your plate at each meal with whole grains. Whole grains include whole-wheat pasta, brown rice, and whole-grain bread. Eat or drink low-fat dairy products, such as skim milk or low-fat yogurt. Fill one fourth of your plate at each meal with low-fat (lean) proteins. Low-fat proteins include fish, chicken without skin, eggs, beans, and tofu. Avoid fatty meat, cured and processed meat, or chicken with skin. Avoid pre-made or processed food. Limit the amount of salt in your diet to less than 1,500 mg each day. Do not drink alcohol if: Your doctor tells you not to drink. You are pregnant, may be pregnant, or are planning to become pregnant. If you drink alcohol: Limit how much you have to: 0-1 drink a day for women. 0-2 drinks a day for men. Know how much alcohol is in your drink. In the [place] one drink equals one 12 oz bottle of beer (355 mL), one 5 oz glass of wine (148 mL), or one 1 oz glass of hard liquor (44 mL). Lifestyle  Work with your doctor to stay at a healthy weight or to lose weight. Ask your doctor what the best weight is for you. Get at least 30 minutes of exercise that causes your heart to beat faster (aerobic exercise) most days of the week. This may include walking, swimming, or biking. Get at least 30 minutes of exercise that strengthens your muscles (resistance exercise) at least 3 days a week. This may include lifting weights or doing Pilates. Do not smoke or use any products that contain nicotine or tobacco. If you need help quitting, ask your doctor. Check your blood pressure  at home as told by your doctor. Keep all follow-up visits. Medicines Take over-the-counter and prescription medicines only as told by your doctor. Follow directions carefully. Do not skip doses of blood pressure medicine. The medicine does not work as well if you skip doses. Skipping doses also puts you at  risk for problems. Ask your doctor about side effects or reactions to medicines that you should watch for. Contact a doctor if: You think you are having a reaction to the medicine you are taking. You have headaches that keep coming back. You feel dizzy. You have swelling in your ankles. You have trouble with your vision. Get help right away if: You get a very bad headache. You start to feel mixed up (confused). You feel weak or numb. You feel faint. You have very bad pain in your: Chest. Belly (abdomen). You vomit more than once. You have trouble breathing. These symptoms may be an emergency. Get help right away. Call 911. Do not wait to see if the symptoms will go away. Do not drive yourself to the hospital. Summary Hypertension is another name for high blood pressure. High blood pressure forces your heart to work harder to pump blood. For most people, a normal blood pressure is less than 120/80. Making healthy choices can help lower blood pressure. If your blood pressure does not get lower with healthy choices, you may need to take medicine. This information is not intended to replace advice given to you by your health care provider. Make sure you discuss any questions you have with your health care provider. Document Revised: 06/10/2021 Document Reviewed: 06/10/2021 Elsevier Patient Education  [place].

## 2022-05-22 ENCOUNTER — Other Ambulatory Visit: Payer: Self-pay | Admitting: Nurse Practitioner

## 2022-05-22 DIAGNOSIS — J3089 Other allergic rhinitis: Secondary | ICD-10-CM

## 2022-05-24 ENCOUNTER — Encounter: Payer: Self-pay | Admitting: Internal Medicine

## 2022-05-27 ENCOUNTER — Encounter: Payer: Self-pay | Admitting: Internal Medicine

## 2022-05-27 ENCOUNTER — Other Ambulatory Visit: Payer: Self-pay

## 2022-06-02 NOTE — Progress Notes (Signed)
[facility] PREP Evaluation  Patient Details  Name: Loretta Norris MRN: [id number] Date of Birth: 09/14/1970 Age: 51 y.o. PCP: Glendale Chard, MD  Vitals:   06/02/22 1835  BP: 130/82  Pulse: 74  Weight: 214 lb 3.2 oz (97.2 kg)     [facility] Eval - 06/02/22 1800       [facility] "PREP" Location   [facility] "PREP" Location [facility]      Referral    Program Start Date --   Final class date 05/31/22     Measurement   Waist Circumference 41 inches    Hip Circumference 47 inches    Body fat 47.5 percent      Information for Trainer   Goals get to a size 14 by 09/04/22      Mobility and Daily Activities   I find it easy to walk up or down two or more flights of stairs. 4    I have no trouble taking out the trash. 4    I do housework such as vacuuming and dusting on my own without difficulty. 3    I can easily lift a gallon of milk (8lbs). 4    I can easily walk a mile. 4    I have no trouble reaching into high cupboards or reaching down to pick up something from the floor. 3    I do not have trouble doing out-door work such as Armed forces logistics/support/administrative officer, raking leaves, or gardening. 2      Mobility and Daily Activities   I feel younger than my age. 3    I feel independent. 4    I feel energetic. 3    I live an active life.  2    I feel strong. 2    I feel healthy. 2    I feel active as other people my age. 3      How fit and strong are you.   Fit and Strong Total Score 43            Past Medical History:  Diagnosis Date   Hypertension    Past Surgical History:  Procedure Laterality Date   DILATION AND CURETTAGE OF UTERUS     DILITATION & CURRETTAGE/HYSTROSCOPY WITH THERMACHOICE ABLATION N/A 04/17/2014   Procedure: DILATATION & CURETTAGE/HYSTEROSCOPY WITH THERMACHOICE ABLATION;  Surgeon: Betsy Coder, MD;  Location: [facility];  Service: Gynecology;  Laterality: N/A;   LEG SURGERY     "removal of cyst"   TUBAL LIGATION     Social History   Tobacco Use  Smoking Status Every Day    Packs/day: 0.25   Years: 15.00   Total pack years: 3.75   Types: Cigarettes  Smokeless Tobacco Never  Tobacco Comments   try to cut back on number of cigs/day  Attended > 22 workouts and 11 educational sessions Fit testing: Cardio march test: 275 to 317 Sit to stand: 12 to 19 Bicep curl: 19 to 32 Balance improved Plans on continuing to exercise at work and at home or at the track during circuits.   Barnett Hatter 06/02/2022, 6:37 PM

## 2022-06-20 ENCOUNTER — Telehealth: Payer: Self-pay

## 2022-06-20 NOTE — Telephone Encounter (Signed)
Did she do Cologuard yet? Did she receive kit? If not, please advise her to complete within the next week.  RS   Patient notified again that she needs to complete her cologuard. Pt stated she will try to complete It this week. YL,RMA

## 2022-07-12 ENCOUNTER — Other Ambulatory Visit: Payer: Self-pay | Admitting: Nurse Practitioner

## 2022-07-12 DIAGNOSIS — J3089 Other allergic rhinitis: Secondary | ICD-10-CM

## 2022-07-19 ENCOUNTER — Other Ambulatory Visit: Payer: Self-pay | Admitting: Obstetrics and Gynecology

## 2022-08-01 ENCOUNTER — Encounter: Payer: Self-pay | Admitting: Nurse Practitioner

## 2022-08-02 ENCOUNTER — Encounter: Payer: Self-pay | Admitting: Nurse Practitioner

## 2022-08-02 ENCOUNTER — Telehealth (INDEPENDENT_AMBULATORY_CARE_PROVIDER_SITE_OTHER): Payer: 59 | Admitting: Nurse Practitioner

## 2022-08-02 DIAGNOSIS — U071 COVID-19: Secondary | ICD-10-CM | POA: Diagnosis not present

## 2022-08-02 MED ORDER — MOLNUPIRAVIR 200 MG PO CAPS: 4.0000 | ORAL_CAPSULE | Freq: Two times a day (BID) | ORAL | 0 refills | Status: AC

## 2022-08-02 MED ORDER — FEXOFENADINE HCL 180 MG PO TABS
180.0000 mg | ORAL_TABLET | Freq: Every day | ORAL | 1 refills | Status: AC
Start: 2022-08-02 — End: ?

## 2022-08-02 NOTE — Patient Instructions (Signed)

## 2022-08-02 NOTE — Progress Notes (Signed)
Virtual Visit via MyChart   This visit type was conducted due to national recommendations for restrictions regarding the COVID-19 Pandemic (e.g. social distancing) in an effort to limit this patient's exposure and mitigate transmission in our community.  Due to her co-morbid illnesses, this patient is at least at moderate risk for complications without adequate follow up.  This format is felt to be most appropriate for this patient at this time.  All issues noted in this document were discussed and addressed.  A limited physical exam was performed with this format.    This visit type was conducted due to national recommendations for restrictions regarding the COVID-19 Pandemic (e.g. social distancing) in an effort to limit this patient's exposure and mitigate transmission in our community.  Patients identity confirmed using two different identifiers.  This format is felt to be most appropriate for this patient at this time.  All issues noted in this document were discussed and addressed.  No physical exam was performed (except for noted visual exam findings with Video Visits).    Date:  08/02/2022   ID:  Loretta Norris, DOB [birth year], MRN [id number]  Patient Location:  Home - spoke with Loretta Norris  Provider location:   Office    Chief Complaint:  positive home covid test  History of Present Illness:    Loretta Norris is a 51 y.o. female who presents via video conferencing for a telehealth visit today.    The patient does have symptoms concerning for COVID-19 infection (fever, chills, cough, or new shortness of breath).   Patient presents today for treatment for Covid-19. She tested positive on Saturday night. Her symptoms began on Saturday morning - cough, congestion, fatigue and touch of diarrhea on Sunday. She retested on Sunday to make sure.  She has been eating and drinking. No loss of taste or smell. She has been taking Mucinex and coricidan, she is taking over the counter  fexofenadine.      Past Medical History:  Diagnosis Date   Hypertension    Past Surgical History:  Procedure Laterality Date   DILATION AND CURETTAGE OF UTERUS     DILITATION & CURRETTAGE/HYSTROSCOPY WITH THERMACHOICE ABLATION N/A 04/17/2014   Procedure: DILATATION & CURETTAGE/HYSTEROSCOPY WITH THERMACHOICE ABLATION;  Surgeon: Betsy Coder, MD;  Location: [facility];  Service: Gynecology;  Laterality: N/A;   LEG SURGERY     "removal of cyst"   TUBAL LIGATION       Current Meds  Medication Sig   amLODipine (NORVASC) 10 MG tablet Take 1 tablet (10 mg total) by mouth daily.   clobetasol ointment (TEMOVATE) 4.09 % Apply 1 application. topically 2 (two) times daily.   Cyanocobalamin (VITAMIN B-12 PO) Take 1 tablet by mouth daily.   Ginger, Zingiber officinalis, (GINGER PO) Take 1 capsule by mouth daily.   ibuprofen (ADVIL,MOTRIN) 200 MG tablet Take 600 mg by mouth every 8 (eight) hours as needed for cramping (menstrual cramping).   molnupiravir EUA (LAGEVRIO) 200 MG CAPS capsule Take 4 capsules (800 mg total) by mouth 2 (two) times daily for 5 days.   OVER THE COUNTER MEDICATION Take 8.6 mg by mouth daily. Vegetable Laxative   triamcinolone cream (KENALOG) 0.1 % APPLY TO AFFECTED AREA TWICE DAILY AS NEEDED   valACYclovir (VALTREX) 500 MG tablet Take 500 mg by mouth daily as needed (prevent flare ups).    valsartan-hydrochlorothiazide (DIOVAN-HCT) 160-25 MG tablet Take 1 tablet by mouth once daily   [DISCONTINUED] EQ ALLERGY RELIEF 180 MG tablet Take  1 tablet by mouth once daily   [DISCONTINUED] fexofenadine (ALLEGRA) 180 MG tablet Take 180 mg by mouth daily.     Allergies:   Allevess [capsaicin-menthol] and Naproxen sodium   Social History   Tobacco Use   Smoking status: Every Day    Packs/day: 0.25    Years: 15.00    Total pack years: 3.75    Types: Cigarettes   Smokeless tobacco: Never   Tobacco comments:    try to cut back on number of cigs/day  Vaping Use   Vaping Use:  Never used  Substance Use Topics   Alcohol use: Yes    Comment: occasional   Drug use: No     Family Hx: The patient's family history includes Asthma in her mother; Hypertension in her mother.  ROS:   Please see the history of present illness.    Review of Systems  Constitutional: Negative.   HENT:  Positive for congestion.   Respiratory:  Positive for cough. Negative for shortness of breath.   Cardiovascular: Negative.   Musculoskeletal: Negative.   Skin: Negative.   Neurological: Negative.   Psychiatric/Behavioral: Negative.      All other systems reviewed and are negative.   Labs/Other Tests and Data Reviewed:    Recent Labs: 02/01/2022: ALT 13 03/15/2022: BUN 15; Creatinine, Ser 0.66; Potassium 4.1; Sodium 143   Recent Lipid Panel Lab Results  Component Value Date/Time   CHOL 202 (H) 11/02/2021 12:12 PM   TRIG 105 11/02/2021 12:12 PM   HDL 95 11/02/2021 12:12 PM   CHOLHDL 2.1 11/02/2021 12:12 PM   CHOLHDL 2.9 Ratio 10/01/2009 09:37 PM   LDLCALC 89 11/02/2021 12:12 PM    Wt Readings from Last 3 Encounters:  06/02/22 214 lb 3.2 oz (97.2 kg)  05/19/22 217 lb 12.8 oz (98.8 kg)  05/17/22 214 lb (97.1 kg)     Exam:    Vital Signs:  There were no vitals taken for this visit.    Physical Exam Constitutional:      General: She is not in acute distress.    Appearance: Normal appearance. She is obese.  Pulmonary:     Effort: Pulmonary effort is normal. No respiratory distress.  Neurological:     General: No focal deficit present.     Mental Status: She is alert and oriented to person, place, and time. Mental status is at baseline.     Cranial Nerves: No cranial nerve deficit.  Psychiatric:        Mood and Affect: Mood and affect normal.        Behavior: Behavior normal.        Thought Content: Thought content normal.        Cognition and Memory: Memory normal.        Judgment: Judgment normal.     ASSESSMENT & PLAN:    1. COVID-19 Advised patient to  take Vitamin C, D, Zinc.  Keep yourself hydrated with a lot of water and rest. Take Delsym for cough and Mucinex as need. Take Tylenol or pain reliever every 4-6 hours as needed for pain/fever/body ache. If you have elevated blood pressure, you can take OTC Corcidin. You can also take OTC oscillococcinum to help with your symptoms.  Educated patient if symptoms get worse or if she experiences any SOB, chest pain or pain in her legs to seek immediate emergency care. Continue to monitor your oxygen levels. Call us if you have any questions. Quarantine for 5 days if tested positive  and no symptoms or 10 days if tested positive and have symptoms. Wear a mask around other people. I have sent a rx for an antiviral if her symptoms worsen. Will remain out of work until Thursday 08/04/2022 - molnupiravir EUA (LAGEVRIO) 200 MG CAPS capsule; Take 4 capsules (800 mg total) by mouth 2 (two) times daily for 5 days.  Dispense: 40 capsule; Refill: 0   COVID-19 Education: The signs and symptoms of COVID-19 were discussed with the patient and how to seek care for testing (follow up with PCP or arrange E-visit).  The importance of social distancing was discussed today.  Patient Risk:   After full review of this patients clinical status, I feel that they are at least moderate risk at this time.  Time:   Today, I have spent 15 minutes/ seconds with the patient with telehealth technology discussing above diagnoses.     Medication Adjustments/Labs and Tests Ordered: Current medicines are reviewed at length with the patient today.  Concerns regarding medicines are outlined above.   Tests Ordered: No orders of the defined types were placed in this encounter.   Medication Changes: Meds ordered this encounter  Medications   fexofenadine (ALLEGRA) 180 MG tablet    Sig: Take 1 tablet (180 mg total) by mouth daily.    Dispense:  90 tablet    Refill:  1   molnupiravir EUA (LAGEVRIO) 200 MG CAPS capsule    Sig: Take 4  capsules (800 mg total) by mouth 2 (two) times daily for 5 days.    Dispense:  40 capsule    Refill:  0    Disposition:  Follow up prn, will get her rescheduled for her physical that was today  Signed, Minette Brine, FNP

## 2022-08-03 ENCOUNTER — Encounter: Payer: Self-pay | Admitting: Nurse Practitioner

## 2022-08-03 ENCOUNTER — Encounter: Payer: Self-pay | Admitting: Internal Medicine

## 2022-08-20 ENCOUNTER — Other Ambulatory Visit: Payer: Self-pay | Admitting: Internal Medicine

## 2022-08-22 ENCOUNTER — Encounter: Payer: Self-pay | Admitting: Internal Medicine

## 2022-08-22 ENCOUNTER — Other Ambulatory Visit: Payer: Self-pay | Admitting: Internal Medicine

## 2022-08-22 MED ORDER — VALSARTAN-HYDROCHLOROTHIAZIDE 160-25 MG PO TABS: 1.0000 | ORAL_TABLET | Freq: Every day | ORAL | 0 refills | Status: DC

## 2022-11-17 ENCOUNTER — Ambulatory Visit: Payer: 59 | Admitting: Internal Medicine

## 2023-08-07 ENCOUNTER — Encounter: Payer: Self-pay | Admitting: Internal Medicine

## 2023-09-04 ENCOUNTER — Encounter: Payer: Self-pay | Admitting: Internal Medicine

## 2023-10-04 ENCOUNTER — Ambulatory Visit (AMBULATORY_SURGERY_CENTER): Payer: 59

## 2023-10-04 VITALS — Ht 61.0 in | Wt 196.0 lb

## 2023-10-04 DIAGNOSIS — Z1211 Encounter for screening for malignant neoplasm of colon: Secondary | ICD-10-CM

## 2023-10-04 MED ORDER — SUFLAVE 178.7 G PO SOLR: 1.0000 | ORAL | 0 refills | Status: DC

## 2023-10-04 NOTE — Progress Notes (Signed)
No egg or soy allergy known to patient  No issues known to pt with past sedation with any surgeries or procedures Patient denies ever being told they had issues or difficulty with intubation  No FH of Malignant Hyperthermia Pt is on phentermine  Pt is not on  home 02  Pt is not on blood thinners  Pt denies issues with constipation while using vegetable laxative. Hx on chronic constipation in the past   No A fib or A flutter Have any cardiac testing pending-- no  LOA: independent  Prep: suflave  Patient's chart reviewed by Cathlyn Parsons CNRA prior to previsit and patient appropriate for the [facility].  Previsit completed and red dot placed by patient's name on their procedure day (on provider's schedule).     PV completed with patient. Prep instructions sent via mychart and home address.

## 2023-10-06 ENCOUNTER — Other Ambulatory Visit: Payer: Self-pay | Admitting: Internal Medicine

## 2023-10-06 MED ORDER — VALSARTAN-HYDROCHLOROTHIAZIDE 160-25 MG PO TABS: 1.0000 | ORAL_TABLET | Freq: Every day | ORAL | 0 refills | Status: AC

## 2023-10-16 ENCOUNTER — Encounter: Payer: Self-pay | Admitting: Gastroenterology

## 2023-10-19 ENCOUNTER — Telehealth: Payer: Self-pay | Admitting: Gastroenterology

## 2023-10-19 NOTE — Telephone Encounter (Signed)
Questions about meds to hold. Instructed no to take laxative day before or day but all other meds on list she can take.

## 2023-10-19 NOTE — Telephone Encounter (Signed)
Patient called and stated that she does not know if she is allowed to take her blood pressure medication along with some vitamins. Patient is requesting a call back. Please advise.

## 2023-10-19 NOTE — Telephone Encounter (Signed)
Attempt made to answer pt''s questoins. Not able to leave message.

## 2023-10-20 ENCOUNTER — Encounter: Payer: Self-pay | Admitting: Internal Medicine

## 2023-10-23 ENCOUNTER — Encounter: Payer: Self-pay | Admitting: Gastroenterology

## 2023-10-23 ENCOUNTER — Ambulatory Visit (AMBULATORY_SURGERY_CENTER): Payer: 59 | Admitting: Gastroenterology

## 2023-10-23 VITALS — BP 145/94 | HR 66 | Temp 97.0°F | Resp 18 | Ht 64.0 in | Wt 196.0 lb

## 2023-10-23 DIAGNOSIS — Z1211 Encounter for screening for malignant neoplasm of colon: Secondary | ICD-10-CM | POA: Diagnosis present

## 2023-10-23 DIAGNOSIS — D124 Benign neoplasm of descending colon: Secondary | ICD-10-CM

## 2023-10-23 DIAGNOSIS — D125 Benign neoplasm of sigmoid colon: Secondary | ICD-10-CM

## 2023-10-23 DIAGNOSIS — D123 Benign neoplasm of transverse colon: Secondary | ICD-10-CM

## 2023-10-23 DIAGNOSIS — K5730 Diverticulosis of large intestine without perforation or abscess without bleeding: Secondary | ICD-10-CM

## 2023-10-23 DIAGNOSIS — K635 Polyp of colon: Secondary | ICD-10-CM

## 2023-10-23 DIAGNOSIS — K621 Rectal polyp: Secondary | ICD-10-CM

## 2023-10-23 DIAGNOSIS — D128 Benign neoplasm of rectum: Secondary | ICD-10-CM

## 2023-10-23 MED ORDER — SODIUM CHLORIDE 0.9 % IV SOLN: 500.0000 mL | Freq: Once | INTRAVENOUS | Status: DC

## 2023-10-23 NOTE — Progress Notes (Signed)
 [facility] History and Physical   Primary Care Physician:  Diamantina Providence, FNP   Reason for Procedure:   Colon cancer screening  Plan:    Screening colonoscopy     HPI: Loretta Norris is a 53 y.o. female undergoing initial average risk screening colonoscopy.  She has no family history of colon cancer and no chronic GI symptoms.    Past Medical History:  Diagnosis Date   Hypertension     Past Surgical History:  Procedure Laterality Date   DILATION AND CURETTAGE OF UTERUS     DILITATION & CURRETTAGE/HYSTROSCOPY WITH THERMACHOICE ABLATION N/A 04/17/2014   Procedure: DILATATION & CURETTAGE/HYSTEROSCOPY WITH THERMACHOICE ABLATION;  Surgeon: Michael Litter, MD;  Location: [facility];  Service: Gynecology;  Laterality: N/A;   LEG SURGERY     "removal of cyst"   TUBAL LIGATION      Prior to Admission medications   Medication Sig Start Date End Date Taking? Authorizing Provider  fexofenadine (ALLEGRA) 180 MG tablet Take 1 tablet (180 mg total) by mouth daily. 08/02/22  Yes Arnette Felts, FNP  OVER THE COUNTER MEDICATION Take 8.6 mg by mouth daily. Vegetable Laxative   Yes [provider]  valsartan-hydrochlorothiazide (DIOVAN-HCT) 160-25 MG tablet Take 1 tablet by mouth daily. 10/06/23  Yes Dorothyann Peng, MD  amLODipine (NORVASC) 10 MG tablet Take 1 tablet (10 mg total) by mouth daily. Patient not taking: Reported on 10/23/2023 05/19/22   Dorothyann Peng, MD  clobetasol ointment (TEMOVATE) 0.05 % Apply 1 application. topically 2 (two) times daily. 11/16/21   Arnette Felts, FNP  Cyanocobalamin (VITAMIN B-12 PO) Take 1 tablet by mouth daily.    [provider]  ibuprofen (ADVIL,MOTRIN) 200 MG tablet Take 600 mg by mouth every 8 (eight) hours as needed for cramping (menstrual cramping).    [provider]  meloxicam (MOBIC) 7.5 MG tablet Take 7.5 mg by mouth daily. 09/16/23   [provider]  phentermine (ADIPEX-P) 37.5 MG tablet Take 1  tablet by mouth daily. 02/25/23   [provider]  triamcinolone cream (KENALOG) 0.1 % APPLY TO AFFECTED AREA TWICE DAILY AS NEEDED 02/01/22   Dorothyann Peng, MD  valACYclovir (VALTREX) 500 MG tablet Take 500 mg by mouth daily as needed (prevent flare ups).  12/24/16   [provider]    Current Outpatient Medications  Medication Sig Dispense Refill   fexofenadine (ALLEGRA) 180 MG tablet Take 1 tablet (180 mg total) by mouth daily. 90 tablet 1   OVER THE COUNTER MEDICATION Take 8.6 mg by mouth daily. Vegetable Laxative     valsartan-hydrochlorothiazide (DIOVAN-HCT) 160-25 MG tablet Take 1 tablet by mouth daily. 90 tablet 0   amLODipine (NORVASC) 10 MG tablet Take 1 tablet (10 mg total) by mouth daily. (Patient not taking: Reported on 10/23/2023) 90 tablet 1   clobetasol ointment (TEMOVATE) 0.05 % Apply 1 application. topically 2 (two) times daily. 45 g 3   Cyanocobalamin (VITAMIN B-12 PO) Take 1 tablet by mouth daily.     ibuprofen (ADVIL,MOTRIN) 200 MG tablet Take 600 mg by mouth every 8 (eight) hours as needed for cramping (menstrual cramping).     meloxicam (MOBIC) 7.5 MG tablet Take 7.5 mg by mouth daily.     phentermine (ADIPEX-P) 37.5 MG tablet Take 1 tablet by mouth daily.     triamcinolone cream (KENALOG) 0.1 % APPLY TO AFFECTED AREA TWICE DAILY AS NEEDED 45 g 0   valACYclovir (VALTREX) 500 MG tablet Take 500 mg by mouth daily  as needed (prevent flare ups).      Current Facility-Administered Medications  Medication Dose Route Frequency Provider Last Rate Last Admin   0.9 %  sodium chloride infusion  500 mL Intravenous Once Jenel Lucks, MD        Allergies as of 10/23/2023 - Review Complete 10/04/2023  Allergen Reaction Noted   Aleve [naproxen] Itching 10/23/2023   Naproxen sodium Hives and Itching 04/06/2009    Family History  Problem Relation Age of Onset   Asthma Mother    Hypertension Mother    Colon cancer Neg Hx    Rectal cancer Neg Hx    Stomach  cancer Neg Hx     Social History   Socioeconomic History   Marital status: Single    Spouse name: Not on file   Number of children: Not on file   Years of education: Not on file   Highest education level: Not on file  Occupational History   Not on file  Tobacco Use   Smoking status: Every Day    Current packs/day: 0.25    Average packs/day: 0.3 packs/day for 15.0 years (3.8 ttl pk-yrs)    Types: Cigarettes   Smokeless tobacco: Never   Tobacco comments:    try to cut back on number of cigs/day  Vaping Use   Vaping status: Never Used  Substance and Sexual Activity   Alcohol use: Yes    Comment: occasional   Drug use: No   Sexual activity: Not on file  Other Topics Concern   Not on file  Social History Narrative   Not on file   Social Drivers of Health   Financial Resource Strain: Not on file  Food Insecurity: Not on file  Transportation Needs: Not on file  Physical Activity: Not on file  Stress: Not on file  Social Connections: Not on file  Intimate Partner Violence: Not on file    Review of Systems:  All other review of systems negative except as mentioned in the HPI.  Physical Exam: Vital signs BP (!) 154/101   Pulse 67   Temp (!) 97 F (36.1 C)   Ht 5\' 4"  (1.626 m)   Wt 196 lb (88.9 kg)   SpO2 98%   BMI 33.64 kg/m   General:   Alert,  Well-developed, well-nourished, pleasant and cooperative in NAD Airway:  Mallampati 1 Lungs:  Clear throughout to auscultation.   Heart:  Regular rate and rhythm; no murmurs, clicks, rubs,  or gallops. Abdomen:  Soft, nontender and nondistended. Normal bowel sounds.   Neuro/Psych:  Normal mood and affect. A and O x 3   Loretta Norris E. Tomasa Rand, MD [facility]

## 2023-10-23 NOTE — Progress Notes (Signed)
 Pt's states no medical or surgical changes since previsit or office visit.

## 2023-10-23 NOTE — Progress Notes (Signed)
 A/o x 3, VSS, gd SR's, pleased with anesthesia, report to RN

## 2023-10-23 NOTE — Op Note (Signed)
 [facility] Patient Name: Loretta Norris Procedure Date: 10/23/2023 8:53 AM MRN: [id number] Endoscopist: Lorin Picket E. Tomasa Rand , MD, [id number] Age: 53 Referring MD:  Date of Birth: 1971-03-31 Gender: Female Account #: [id number] Procedure:                Colonoscopy Indications:              Screening for colorectal malignant neoplasm, This                            is the patient's first colonoscopy Medicines:                Monitored Anesthesia Care Procedure:                Pre-Anesthesia Assessment:                           - Prior to the procedure, a History and Physical                            was performed, and patient medications and                            allergies were reviewed. The patient's tolerance of                            previous anesthesia was also reviewed. The risks                            and benefits of the procedure and the sedation                            options and risks were discussed with the patient.                            All questions were answered, and informed consent                            was obtained. Prior Anticoagulants: The patient has                            taken no anticoagulant or antiplatelet agents. ASA                            Grade Assessment: II - A patient with mild systemic                            disease. After reviewing the risks and benefits,                            the patient was deemed in satisfactory condition to                            undergo the procedure.  After obtaining informed consent, the colonoscope                            was passed under direct vision. Throughout the                            procedure, the patient's blood pressure, pulse, and                            oxygen saturations were monitored continuously. The                            Olympus Scope SN [id number] was introduced through the                            anus and  advanced to the the cecum, identified by                            appendiceal orifice and ileocecal valve. The                            colonoscopy was performed without difficulty. The                            patient tolerated the procedure well. The quality                            of the bowel preparation was good. The ileocecal                            valve, appendiceal orifice, and rectum were                            photographed. The bowel preparation used was                            SUFLAVE via split dose instruction. Scope In: 9:06:02 AM Scope Out: 9:22:57 AM Scope Withdrawal Time: 0 hours 12 minutes 38 seconds  Total Procedure Duration: 0 hours 16 minutes 55 seconds  Findings:                 The perianal and digital rectal examinations were                            normal. Pertinent negatives include normal                            sphincter tone and no palpable rectal lesions.                           A 5 mm polyp was found in the transverse colon. The                            polyp was sessile. The polyp was  removed with a                            cold snare. Resection and retrieval were complete.                            Estimated blood loss was minimal.                           A 4 mm polyp was found in the descending colon. The                            polyp was sessile. The polyp was removed with a                            cold snare. Resection and retrieval were complete.                            Estimated blood loss was minimal.                           Many hyperplastic, non-bleeding polyps were found                            in the rectum and sigmoid colon. The polyps were                            small in size. One of these polyps was removed with                            a cold snare. Resection and retrieval were                            complete. Estimated blood loss was minimal.                           Many  large-mouthed and small-mouthed diverticula                            were found in the sigmoid colon, descending colon,                            transverse colon, ascending colon and cecum. There                            was evidence of an impacted diverticulum.                           The exam was otherwise normal throughout the                            examined colon.  The retroflexed view of the distal rectum and anal                            verge was normal and showed no anal or rectal                            abnormalities. Complications:            No immediate complications. Estimated Blood Loss:     Estimated blood loss was minimal. Impression:               - One 5 mm polyp in the transverse colon, removed                            with a cold snare. Resected and retrieved.                           - One 4 mm polyp in the descending colon, removed                            with a cold snare. Resected and retrieved.                           - Many small, non-bleeding polyps in the rectum and                            in the sigmoid colon, removed with a cold snare.                            Resected and retrieved. These were consistent with                            hyperplastic polyps.                           - Moderate diverticulosis in the sigmoid colon, in                            the descending colon, in the transverse colon, in                            the ascending colon and in the cecum. There was                            evidence of an impacted diverticulum.                           - The distal rectum and anal verge are normal on                            retroflexion view. Recommendation:           - Patient has a contact number available for  emergencies. The signs and symptoms of potential                            delayed complications were discussed with the                             patient. Return to normal activities tomorrow.                            Written discharge instructions were provided to the                            patient.                           - Resume previous diet.                           - Continue present medications.                           - Await pathology results.                           - Repeat colonoscopy (date not yet determined) for                            surveillance based on pathology results.                           - Recommend high fiber diet/daily fiber supplement                            to reduce risk of diverticular complications. Aziya Arena E. Tomasa Rand, MD 10/23/2023 9:31:12 AM This report has been signed electronically.

## 2023-10-23 NOTE — Patient Instructions (Addendum)
-  Handout on polyps, diverticulosis, high fiber diet provided -await pathology results -repeat colonoscopy for surveillance recommended. Date to be determined when pathology result become available   -Continue present medications    YOU HAD AN ENDOSCOPIC PROCEDURE TODAY AT THE [facility]:   Refer to the procedure report that was given to you for any specific questions about what was found during the examination.  If the procedure report does not answer your questions, please call your gastroenterologist to clarify.  If you requested that your care partner not be given the details of your procedure findings, then the procedure report has been included in a sealed envelope for you to review at your convenience later.  YOU SHOULD EXPECT: Some feelings of bloating in the abdomen. Passage of more gas than usual.  Walking can help get rid of the air that was put into your GI tract during the procedure and reduce the bloating. If you had a lower endoscopy (such as a colonoscopy or flexible sigmoidoscopy) you may notice spotting of blood in your stool or on the toilet paper. If you underwent a bowel prep for your procedure, you may not have a normal bowel movement for a few days.  Please Note:  You might notice some irritation and congestion in your nose or some drainage.  This is from the oxygen used during your procedure.  There is no need for concern and it should clear up in a day or so.  SYMPTOMS TO REPORT IMMEDIATELY:  Following lower endoscopy (colonoscopy or flexible sigmoidoscopy):  Excessive amounts of blood in the stool  Significant tenderness or worsening of abdominal pains  Swelling of the abdomen that is new, acute  Fever of 100F or higher   For urgent or emergent issues, a gastroenterologist can be reached at any hour by calling [phone]. Do not use MyChart messaging for urgent concerns.    DIET:  We do recommend a small meal at first, but then you may proceed to  your regular diet.  Drink plenty of fluids but you should avoid alcoholic beverages for 24 hours.  ACTIVITY:  You should plan to take it easy for the rest of today and you should NOT DRIVE or use heavy machinery until tomorrow (because of the sedation medicines used during the test).    FOLLOW UP: Our staff will call the number listed on your records the next business day following your procedure.  We will call around 7:15- 8:00 am to check on you and address any questions or concerns that you may have regarding the information given to you following your procedure. If we do not reach you, we will leave a message.     If any biopsies were taken you will be contacted by phone or by letter within the next 1-3 weeks.  Please call us at [phone] if you have not heard about the biopsies in 3 weeks.    SIGNATURES/CONFIDENTIALITY: You and/or your care partner have signed paperwork which will be entered into your electronic medical record.  These signatures attest to the fact that that the information above on your After Visit Summary has been reviewed and is understood.  Full responsibility of the confidentiality of this discharge information lies with you and/or your care-partner.

## 2023-10-24 ENCOUNTER — Telehealth: Payer: Self-pay

## 2023-10-24 NOTE — Telephone Encounter (Signed)
 Attempted follow up call to pt, but unable to leave message.

## 2023-10-25 LAB — SURGICAL PATHOLOGY

## 2023-11-04 ENCOUNTER — Encounter: Payer: Self-pay | Admitting: Gastroenterology

## 2023-11-04 NOTE — Progress Notes (Signed)
 Loretta Norris,  One polyp which I removed during your recent procedure was proven to be completely benign but is considered a "pre-cancerous" polyp that MAY have grown into cancer if it had not been removed.  One polyp removed was not able to be processed.  The other polyps were not precancerous.  Studies shows that at least 20% of women over age 54 and 30% of men over age 47 have pre-cancerous polyps.  Based on current nationally recognized surveillance guidelines, I recommend that you have a repeat colonoscopy in 7 years.   If you develop any new rectal bleeding, abdominal pain or significant bowel habit changes, please contact me before then.

## 2024-02-06 ENCOUNTER — Encounter (HOSPITAL_BASED_OUTPATIENT_CLINIC_OR_DEPARTMENT_OTHER): Payer: Self-pay | Admitting: Internal Medicine

## 2024-02-06 DIAGNOSIS — R0683 Snoring: Secondary | ICD-10-CM

## 2024-04-09 NOTE — Procedures (Signed)
 Orders only
